# Patient Record
Sex: Female | Born: 1985 | Race: Black or African American | Hispanic: No | Marital: Single | Smoking: Never smoker
Health system: Southern US, Community
[De-identification: ages and names within clinical notes are randomized; demographics above are authoritative.]

## PROBLEM LIST (undated history)

## (undated) DIAGNOSIS — R011 Cardiac murmur, unspecified: Secondary | ICD-10-CM

## (undated) DIAGNOSIS — E669 Obesity, unspecified: Secondary | ICD-10-CM

## (undated) HISTORY — PX: CERVICAL BIOPSY  W/ LOOP ELECTRODE EXCISION: SUR135

## (undated) HISTORY — DX: Cardiac murmur, unspecified: R01.1

---

## 2013-03-04 LAB — OB RESULTS CONSOLE HGB/HCT, BLOOD
HCT: 37 %
HCT: 37 %

## 2013-03-04 LAB — OB RESULTS CONSOLE PLATELET COUNT: Platelets: 252 10*3/uL

## 2013-03-04 LAB — OB RESULTS CONSOLE ABO/RH

## 2013-03-04 LAB — OB RESULTS CONSOLE ANTIBODY SCREEN: Antibody Screen: NEGATIVE

## 2013-03-04 LAB — OB RESULTS CONSOLE RPR: RPR: NONREACTIVE

## 2013-05-01 ENCOUNTER — Encounter (HOSPITAL_BASED_OUTPATIENT_CLINIC_OR_DEPARTMENT_OTHER): Payer: Self-pay | Admitting: Student

## 2013-05-01 ENCOUNTER — Emergency Department (HOSPITAL_BASED_OUTPATIENT_CLINIC_OR_DEPARTMENT_OTHER)
Admission: EM | Admit: 2013-05-01 | Discharge: 2013-05-01 | Disposition: A | Discharge status: Home / Self Care | Payer: Medicaid Other | Attending: Emergency Medicine | Admitting: Emergency Medicine

## 2013-05-01 ENCOUNTER — Emergency Department (HOSPITAL_BASED_OUTPATIENT_CLINIC_OR_DEPARTMENT_OTHER): Payer: Medicaid Other

## 2013-05-01 DIAGNOSIS — Z9889 Other specified postprocedural states: Secondary | ICD-10-CM | POA: Insufficient documentation

## 2013-05-01 DIAGNOSIS — N898 Other specified noninflammatory disorders of vagina: Secondary | ICD-10-CM | POA: Insufficient documentation

## 2013-05-01 DIAGNOSIS — Z880 Allergy status to penicillin: Secondary | ICD-10-CM | POA: Insufficient documentation

## 2013-05-01 DIAGNOSIS — R1031 Right lower quadrant pain: Secondary | ICD-10-CM | POA: Insufficient documentation

## 2013-05-01 DIAGNOSIS — N760 Acute vaginitis: Secondary | ICD-10-CM | POA: Insufficient documentation

## 2013-05-01 DIAGNOSIS — N949 Unspecified condition associated with female genital organs and menstrual cycle: Secondary | ICD-10-CM | POA: Insufficient documentation

## 2013-05-01 DIAGNOSIS — B9689 Other specified bacterial agents as the cause of diseases classified elsewhere: Secondary | ICD-10-CM | POA: Insufficient documentation

## 2013-05-01 DIAGNOSIS — O26899 Other specified pregnancy related conditions, unspecified trimester: Secondary | ICD-10-CM | POA: Insufficient documentation

## 2013-05-01 DIAGNOSIS — R110 Nausea: Secondary | ICD-10-CM | POA: Insufficient documentation

## 2013-05-01 DIAGNOSIS — Z79899 Other long term (current) drug therapy: Secondary | ICD-10-CM | POA: Insufficient documentation

## 2013-05-01 DIAGNOSIS — Z3490 Encounter for supervision of normal pregnancy, unspecified, unspecified trimester: Secondary | ICD-10-CM

## 2013-05-01 DIAGNOSIS — A499 Bacterial infection, unspecified: Secondary | ICD-10-CM | POA: Insufficient documentation

## 2013-05-01 DIAGNOSIS — N390 Urinary tract infection, site not specified: Secondary | ICD-10-CM | POA: Insufficient documentation

## 2013-05-01 LAB — COMPREHENSIVE METABOLIC PANEL
AST: 14 U/L (ref 0–37)
Albumin: 3.1 g/dL — ABNORMAL LOW (ref 3.5–5.2)
BUN: 8 mg/dL (ref 6–23)
Creatinine, Ser: 0.8 mg/dL (ref 0.50–1.10)
Total Protein: 7.5 g/dL (ref 6.0–8.3)

## 2013-05-01 LAB — URINALYSIS, ROUTINE W REFLEX MICROSCOPIC
Glucose, UA: NEGATIVE mg/dL
Ketones, ur: NEGATIVE mg/dL
Protein, ur: 100 mg/dL — AB
Urobilinogen, UA: 0.2 mg/dL (ref 0.0–1.0)

## 2013-05-01 LAB — CBC WITH DIFFERENTIAL/PLATELET
Basophils Absolute: 0 10*3/uL (ref 0.0–0.1)
Basophils Relative: 0 % (ref 0–1)
Eosinophils Absolute: 0 10*3/uL (ref 0.0–0.7)
HCT: 33.6 % — ABNORMAL LOW (ref 36.0–46.0)
Hemoglobin: 12 g/dL (ref 12.0–15.0)
MCH: 30.5 pg (ref 26.0–34.0)
MCHC: 35.7 g/dL (ref 30.0–36.0)
Monocytes Absolute: 0.8 10*3/uL (ref 0.1–1.0)
Monocytes Relative: 7 % (ref 3–12)
Neutrophils Relative %: 75 % (ref 43–77)
RDW: 13.2 % (ref 11.5–15.5)

## 2013-05-01 LAB — URINE MICROSCOPIC-ADD ON

## 2013-05-01 LAB — PREGNANCY, URINE: Preg Test, Ur: POSITIVE — AB

## 2013-05-01 LAB — WET PREP, GENITAL
Trich, Wet Prep: NONE SEEN
Yeast Wet Prep HPF POC: NONE SEEN

## 2013-05-01 LAB — LIPASE, BLOOD: Lipase: 15 U/L (ref 11–59)

## 2013-05-01 MED ORDER — DEXTROSE 5 % IV SOLN
1.0000 g | Freq: Once | INTRAVENOUS | Status: AC
  Administered 2013-05-01: 1 g via INTRAVENOUS
  Filled 2013-05-01: qty 10

## 2013-05-01 MED ORDER — ONDANSETRON HCL 4 MG/2ML IJ SOLN
4.0000 mg | Freq: Once | INTRAMUSCULAR | Status: AC
  Administered 2013-05-01: 4 mg via INTRAVENOUS
  Filled 2013-05-01: qty 2

## 2013-05-01 MED ORDER — SODIUM CHLORIDE 0.9 % IV SOLN
INTRAVENOUS | Status: DC
  Administered 2013-05-01: 19:00:00 via INTRAVENOUS

## 2013-05-01 MED ORDER — METRONIDAZOLE 500 MG PO TABS: 500.0000 mg | ORAL_TABLET | Freq: Two times a day (BID) | ORAL | Status: DC

## 2013-05-01 MED ORDER — MORPHINE SULFATE 2 MG/ML IJ SOLN
2.0000 mg | Freq: Once | INTRAMUSCULAR | Status: AC
  Administered 2013-05-01: 2 mg via INTRAVENOUS
  Filled 2013-05-01: qty 1

## 2013-05-01 MED ORDER — ACETAMINOPHEN 325 MG PO TABS
325.0000 mg | ORAL_TABLET | Freq: Once | ORAL | Status: AC
  Administered 2013-05-01: 325 mg via ORAL
  Filled 2013-05-01: qty 1

## 2013-05-01 MED ORDER — ONDANSETRON HCL 4 MG PO TABS: 4.0000 mg | ORAL_TABLET | Freq: Four times a day (QID) | ORAL | Status: DC

## 2013-05-01 MED ORDER — NITROFURANTOIN MONOHYD MACRO 100 MG PO CAPS: 100.0000 mg | ORAL_CAPSULE | Freq: Two times a day (BID) | ORAL | Status: DC

## 2013-05-01 NOTE — ED Provider Notes (Signed)
This chart was scribed for Donna Skeens, MD by Ardelia Mems, ED Scribe. This patient was seen in room MH11/MH11 and the patient's care was started at 7:28 PM.  HPI Comments: Donna Hayes is a 27 y.o. Female, [redacted] weeks pregnant, who presents to the Emergency Department complaining of gradual onset, gradually worsening, "cramping" abdominal pain which is generalized, but worst in RLQ abdomen, onset 3 days ago. She states that she had a normal [place] about 5-6 weeks ago. She states that this is her 6th pregnancy, and that she has had 3 miscarriages, with which she has had similar pain. She denies history of ectopic pregnancies.  She denies any surgical abdominal history with the exception of a C-section. She denies any history of kidney stones. She states that she has had no new sexual partners. She states that she has a history of gonorrhea and/or chlamydia infection. She denies dysuria.   Physical Exam Diffuse abdominal pain, worst in the LLQ and RLQ. RRR.    7:32 PM- Bedside US performed. Fetal movement observed. Fetal HR of 162. Biparietal diameter indicates age of 17 weeks and 3 days.  Procedure EMERGENCY DEPARTMENT [place] PREGNANCY "Study: Limited Ultrasound of the Pelvis"  INDICATIONS:Pregnancy(required) abd pain Multiple views of the uterus and pelvic cavity are obtained with a multi-frequency probe.  APPROACH:Transabdominal   PERFORMED BY: Myself  IMAGES ARCHIVED?: Yes  LIMITATIONS: Body habitus  PREGNANCY FREE FLUID: Small  ADNEXAL FINDINGS: unable to appreciate due to body habitus  PREGNANCY FINDINGS: Intrauterine gestational sac noted and Fetal heart activity seen  INTERPRETATION: Viable intrauterine pregnancy  GESTATIONAL AGE, ESTIMATE: 17 wk 3 days  FETAL HEART RATE: 162  RLQ and right lower pelvic pain.  With no hx of ectopic and gradual worsening pain/ vaginal discharge for 2 -3 days concern for appy vs less likely PID/ ectopic.  Pain meds, fluids, labs. Plan for  transfer to women[facility] for further evaluation and imaging.  Unable to obtain formal US in ED, bedside performed.  Rocephin in ED.   OB physician rec CT scan. Discussed r/ b with patient in detail.  Pt okay with CT scan to rule out appy.  Improved in ED.  CT no acute findings.  Abx given in ED.  DC  I personally performed the services described in this documentation, which was scribed in my presence. The recorded information has been reviewed and is accurate.     Donna Skeens, MD 05/02/13 [phone]

## 2013-05-01 NOTE — ED Notes (Signed)
LAC drawn by RN Shary Key, Results 0 .67 hand delivered by this RT to Dr. Jodi Mourning at [phone].

## 2013-05-01 NOTE — ED Provider Notes (Signed)
CSN: [id number]     Arrival date & time 05/01/13  1336 History     First MD Initiated Contact with Patient 05/01/13 1752     Chief Complaint  Patient presents with  . Back Pain    cramps in lower back and right side.    (Consider location/radiation/quality/duration/timing/severity/associated sxs/prior Treatment) The history is provided by the patient. No language interpreter was used.  Donna Hayes is a 27 y/o F presenting to the ED with right sided abdominal/pelvic pain that has been ongoing for the past 3 days, with worsening of the pain today - patient reported being [redacted] weeks pregnant. Patient reported that the pain is localized to the right lower abdominal region described as a constant cramping sensation that is hard and deep with intermittent sharp shooting pain that lasts a couple of seconds. Patient reported that the pain radiates to the lower back on her right side. Stated that nothing makes the pain better or worse and stated that she has not used anything for the discomfort. Reported that she has been having nausea associated with increased pain. Reported that she has been experiencing vaginal discharge described as a creamy, white discharge that is non-malodorous. Reported that she has a UTI x 7 weeks ago and was placed on antibiotic therapy. Denied fever, chills, vomiting, diarrhea, neck pain, neck stiffness, vaginal bleeding, numbness, tingling, pruritis, vaginal pain.  [id number] - patient has history of GC/Chlamydia OBGYN Dr. Achilles Dunk   History reviewed. No pertinent past medical history. Past Surgical History  Procedure Laterality Date  . Cesarean section     History reviewed. No pertinent family history. History  Substance Use Topics  . Smoking status: Never Smoker   . Smokeless tobacco: Not on file  . Alcohol Use: No   OB History   Grav Para Term Preterm Abortions TAB SAB Ect Mult Living   1              Review of Systems  Constitutional: Negative for fever and  chills.  HENT: Negative for neck pain and neck stiffness.   Eyes: Negative for visual disturbance.  Respiratory: Negative for cough, chest tightness and shortness of breath.   Gastrointestinal: Positive for nausea and abdominal pain. Negative for vomiting, diarrhea, constipation and blood in stool.  Genitourinary: Positive for vaginal discharge and pelvic pain. Negative for dysuria, flank pain, vaginal bleeding and vaginal pain.  Musculoskeletal: Positive for back pain.  Neurological: Negative for dizziness, weakness and light-headedness.  All other systems reviewed and are negative.    Allergies  Penicillins  Home Medications   Current Outpatient Rx  Name  Route  Sig  Dispense  Refill  . metroNIDAZOLE (FLAGYL) 500 MG tablet   Oral   Take 1 tablet (500 mg total) by mouth 2 (two) times daily. One po bid x 7 days   14 tablet   0   . nitrofurantoin, macrocrystal-monohydrate, (MACROBID) 100 MG capsule   Oral   Take 1 capsule (100 mg total) by mouth 2 (two) times daily. X 7 days   14 capsule   0   . ondansetron (ZOFRAN) 4 MG tablet   Oral   Take 1 tablet (4 mg total) by mouth every 6 (six) hours.   12 tablet   0   . Prenatal Vit-Fe Fumarate-FA (MULTIVITAMIN-PRENATAL) 27-0.8 MG TABS tablet   Oral   Take 1 tablet by mouth daily at 12 noon.          BP 120/71  Pulse 61  Temp(Src) 98.8 F (37.1 C) (Oral)  Resp 20  Ht 5\' 2"  (1.575 m)  Wt 249 lb (112.946 kg)  BMI 45.53 kg/m2  SpO2 100%  LMP 01/02/2013 Physical Exam  Nursing note and vitals reviewed. Constitutional: She is oriented to person, place, and time. She appears well-developed and well-nourished. No distress.  HENT:  Head: Normocephalic and atraumatic.  Eyes: Conjunctivae and EOM are normal. Pupils are equal, round, and reactive to light. Right eye exhibits no discharge. Left eye exhibits no discharge.  Neck: Normal range of motion. Neck supple.  Cardiovascular: Normal rate, regular rhythm and normal heart  sounds.  Exam reveals no friction rub.   No murmur heard. Pulses:      Radial pulses are 2+ on the right side, and 2+ on the left side.       Dorsalis pedis pulses are 2+ on the right side, and 2+ on the left side.  Pulmonary/Chest: Effort normal and breath sounds normal. No respiratory distress. She has no wheezes. She has no rales.  Abdominal: Soft. Bowel sounds are normal. She exhibits no distension. There is tenderness in the right lower quadrant and suprapubic area. There is tenderness at McBurney's point. There is no rigidity, no rebound, no guarding and no CVA tenderness.    Pain localize to the RLQ and right pelvic region  Positive McBurney's point Negative psoas and obturator  Musculoskeletal: Normal range of motion.  Lymphadenopathy:    She has no cervical adenopathy.  Neurological: She is alert and oriented to person, place, and time. She exhibits normal muscle tone. Coordination normal.  Skin: Skin is warm and dry. She is not diaphoretic.  Psychiatric: She has a normal mood and affect. Her behavior is normal. Thought content normal.    ED Course   Procedures (including critical care time)  9:09PM Spoke with Dr. Tomi Bamberger from [facility] regarding patient - physician recommended CT scan of abdomen without contrast to rule out appendicitis and if CT scan negative patient is able to be discharged and follow-up with OBGYN.   9:12PM Discussed with patient CT scan to rule out appendicitis- discussed risk and benefits. Patient declined the scan, reported that she does not want to get CT scan or any other radiation exposure.  9:20PM Dr. Jodi Mourning spoke with patient, patient agreed to CT scan - patient signed consent form.  Dr. Jodi Mourning performed bedside US, since no [place] at [facility] on the weekends - intrauterine pregnancy reported.    Medications  ondansetron (ZOFRAN) injection 4 mg (4 mg Intravenous Given 05/01/13 1915)  acetaminophen (TYLENOL) tablet 325 mg (325 mg Oral Given  05/01/13 1908)  cefTRIAXone (ROCEPHIN) 1 g in dextrose 5 % 50 mL IVPB (0 g Intravenous Stopped 05/01/13 2121)  morphine 2 MG/ML injection 2 mg (2 mg Intravenous Given 05/01/13 2117)    Labs Reviewed  WET PREP, GENITAL - Abnormal; Notable for the following:    Clue Cells Wet Prep HPF POC TOO NUMEROUS TO COUNT (*)    WBC, Wet Prep HPF POC FEW (*)    All other components within normal limits  URINALYSIS, ROUTINE W REFLEX MICROSCOPIC - Abnormal; Notable for the following:    APPearance CLOUDY (*)    Hgb urine dipstick SMALL (*)    Protein, ur 100 (*)    Nitrite POSITIVE (*)    Leukocytes, UA TRACE (*)    All other components within normal limits  PREGNANCY, URINE - Abnormal; Notable for the following:    Preg Test, Ur POSITIVE (*)  All other components within normal limits  CBC WITH DIFFERENTIAL - Abnormal; Notable for the following:    HCT 33.6 (*)    Neutro Abs 7.9 (*)    All other components within normal limits  COMPREHENSIVE METABOLIC PANEL - Abnormal; Notable for the following:    Albumin 3.1 (*)    Total Bilirubin 0.2 (*)    All other components within normal limits  HCG, QUANTITATIVE, PREGNANCY - Abnormal; Notable for the following:    hCG, Beta Chain, Quant, S 26124 (*)    All other components within normal limits  URINE MICROSCOPIC-ADD ON - Abnormal; Notable for the following:    Squamous Epithelial / LPF FEW (*)    Bacteria, UA MANY (*)    All other components within normal limits  GC/CHLAMYDIA PROBE AMP  URINE CULTURE  LIPASE, BLOOD   Ct Abdomen Pelvis Wo Contrast  05/01/2013   *RADIOLOGY REPORT*  Clinical Data: Diffuse low abdominal pain.  Evaluate for appendicitis.  The patient is [redacted] weeks pregnant.  CT ABDOMEN AND PELVIS WITHOUT CONTRAST  Technique:  Multidetector CT imaging of the abdomen and pelvis was performed following the standard protocol without intravenous contrast. The patient signed consent for the procedure.  Comparison: None.  Findings: The lung bases are  clear.  There is no pleural effusion.  The appendix is well visualized and is normal in caliber without surrounding inflammatory change.  The bowel gas pattern is normal. There is no extraluminal fluid collection.  Gravid uterus is noted.  There is asymmetric right-sided hydronephrosis and hydroureter.  No obstructing calculus is demonstrated.  The left kidney appears normal.  The liver demonstrates mild steatosis without focal abnormality. The gallbladder, spleen, pancreas and adrenal glands appear normal.  No acute osseous findings are demonstrated.  IMPRESSION:  1.  No evidence of acute appendicitis nor other active inflammatory process. 2.  Right-sided hydronephrosis and hydroureter may be secondary to the gravid uterus.  No ureteral calculus is identified. Correlation with urinalysis recommended. 3.  Mild hepatic steatosis.   Original Report Authenticated By: Carey Bullocks, M.D.   1. BV (bacterial vaginosis)   2. UTI (urinary tract infection)   3. Pregnant     MDM  Patient presenting to the ED with right-sided pelvic pain ongoing for the past 2 days worsening starting today with radiation to the lower back associated with thick white discharge. Patient is currently [redacted] weeks pregnant. Tenderness upon palpation to the right lower quadrant right pelvic region. Negative acute abdomen, negative peritoneal signs. McBurney's point positive - negative obturator and psoas sign. Pelvic noted thick white discharge with right adnexal tenderness upon palpation. Negative CMT. Negative blood in vaginal vault. US performed at bedside by Dr. Jodi Mourning - intrauterine pregnancy identified. Urine pregnancy positive. Beta-hCG proper elevation coinciding with the weeks, properly elevating. Lipase negative elevation. UA positive for UTI. Wet prep clue cells noted. CT abdomen negative appendicitis findings.  Doubt ectopic pregnancy - patient at high risk since had history of STDs - [place] bedside performed by Dr. Jodi Mourning and  identified intrauterine pregnancy. Doubt miscarriage. Patient given pain medications, rocephin while in ED. Discussed case with Dr. Jodi Mourning and Dr. Jodi Mourning assessed patient. Suspicion of right pelvic pain secondary to BV and UTI - patient discharged with antibiotics and recommended Tylenol for pain control. Referred patient to OBGYN and stressed importance of being re-checked on Monday. Discussed with patient that further testing needs to be performed since BV is a STD. Discussed with patient to rest and stay hydrated.  Discussed with patient to continue to monitor symptoms closely and if symptoms are to worsen or change to report back to the ED - strict return instructions given.  Patient agreed to plan of care, understood, all questions answered.   Raymon Mutton, PA-C 05/02/13 0223  Raymon Mutton, PA-C 05/02/13 0226  Medical screening examination/treatment/procedure(s) were conducted as a shared visit with non-physician practitioner(s) or resident  and myself.  I personally evaluated the patient during the encounter and agree with the findings and plan unless otherwise indicated.  See attached note for detailed exam and plan.    [name], MD 05/02/13 [phone]

## 2013-05-01 NOTE — ED Notes (Signed)
3 miscarriages, 6 total pregnancies, 1 live birth. Reports prior hx of similar issues. Called OBGYN with onset 3 days ago. No vaginal bleeding, + vaginal dc - creamy in nature, denies dysuria.

## 2013-05-02 LAB — GC/CHLAMYDIA PROBE AMP
CT Probe RNA: NEGATIVE
GC Probe RNA: NEGATIVE

## 2013-05-03 LAB — CG4 I-STAT (LACTIC ACID): Lactic Acid, Venous: 0.67 mmol/L (ref 0.5–2.2)

## 2013-05-04 LAB — URINE CULTURE: Colony Count: >=100000

## 2013-05-05 NOTE — ED Notes (Signed)
+   Urine Culture Patient treated per protocol MD.  

## 2013-07-01 LAB — OB RESULTS CONSOLE HGB/HCT, BLOOD: Hemoglobin: 10.3 g/dL

## 2013-07-01 LAB — OB RESULTS CONSOLE RPR: RPR: NONREACTIVE

## 2013-07-02 LAB — GLUCOSE, 3 HOUR GESTATIONAL
Glucose, GTT - 2 Hour: 155 mg/dL — AB (ref ?–140)
Glucose, GTT - 3 Hour: 117 mg/dL (ref ?–140)

## 2013-07-22 ENCOUNTER — Encounter: Payer: Medicaid Other | Admitting: Advanced Practice Midwife

## 2013-09-09 ENCOUNTER — Ambulatory Visit (INDEPENDENT_AMBULATORY_CARE_PROVIDER_SITE_OTHER): Payer: Medicaid Other | Admitting: Obstetrics & Gynecology

## 2013-09-09 ENCOUNTER — Encounter: Payer: Self-pay | Admitting: Obstetrics & Gynecology

## 2013-09-09 VITALS — BP 116/83 | Temp 98.6°F | Wt 274.7 lb

## 2013-09-09 DIAGNOSIS — Z3490 Encounter for supervision of normal pregnancy, unspecified, unspecified trimester: Secondary | ICD-10-CM | POA: Insufficient documentation

## 2013-09-09 DIAGNOSIS — Z98891 History of uterine scar from previous surgery: Secondary | ICD-10-CM

## 2013-09-09 DIAGNOSIS — O34219 Maternal care for unspecified type scar from previous cesarean delivery: Secondary | ICD-10-CM | POA: Insufficient documentation

## 2013-09-09 DIAGNOSIS — Z3493 Encounter for supervision of normal pregnancy, unspecified, third trimester: Secondary | ICD-10-CM

## 2013-09-09 LAB — POCT URINALYSIS DIP (DEVICE)
Bilirubin Urine: NEGATIVE
Glucose, UA: NEGATIVE mg/dL
Hgb urine dipstick: NEGATIVE
Ketones, ur: NEGATIVE mg/dL
Protein, ur: NEGATIVE mg/dL
Specific Gravity, Urine: 1.015 (ref 1.005–1.030)
pH: 7 (ref 5.0–8.0)

## 2013-09-09 NOTE — Progress Notes (Signed)
Pulse- 98  Pressure-lower abd, vaginal  New ob packet given Weight gain 11-20lbs Flu and tdap given @ [facility]

## 2013-09-09 NOTE — Progress Notes (Signed)
transfer from [facility], counseled re TOLAC vs repeat cesarean, will discuss next visit

## 2013-09-09 NOTE — Patient Instructions (Signed)

## 2013-09-10 LAB — PRESCRIPTION MONITORING PROFILE (19 PANEL)
Benzodiazepine Screen, Urine: NEGATIVE ng/mL
Buprenorphine, Urine: NEGATIVE ng/mL
Cannabinoid Scrn, Ur: NEGATIVE ng/mL
Cocaine Metabolites: NEGATIVE ng/mL
Fentanyl, Ur: NEGATIVE ng/mL
MDMA URINE: NEGATIVE ng/mL
Meperidine, Ur: NEGATIVE ng/mL
Methaqualone: NEGATIVE ng/mL
Opiate Screen, Urine: NEGATIVE ng/mL
Phencyclidine, Ur: NEGATIVE ng/mL
Propoxyphene: NEGATIVE ng/mL
Zolpidem, Urine: NEGATIVE ng/mL

## 2013-09-12 LAB — CULTURE, OB URINE: Colony Count: 80000

## 2013-09-15 ENCOUNTER — Ambulatory Visit (INDEPENDENT_AMBULATORY_CARE_PROVIDER_SITE_OTHER): Payer: Medicaid Other | Admitting: Obstetrics & Gynecology

## 2013-09-15 VITALS — BP 137/93 | Wt 274.2 lb

## 2013-09-15 DIAGNOSIS — Z3493 Encounter for supervision of normal pregnancy, unspecified, third trimester: Secondary | ICD-10-CM

## 2013-09-15 DIAGNOSIS — O34219 Maternal care for unspecified type scar from previous cesarean delivery: Secondary | ICD-10-CM

## 2013-09-15 DIAGNOSIS — Z98891 History of uterine scar from previous surgery: Secondary | ICD-10-CM

## 2013-09-15 LAB — OB RESULTS CONSOLE GBS: GBS: POSITIVE

## 2013-09-15 LAB — POCT URINALYSIS DIP (DEVICE)
Bilirubin Urine: NEGATIVE
Ketones, ur: NEGATIVE mg/dL
Nitrite: NEGATIVE
Protein, ur: NEGATIVE mg/dL
pH: 6.5 (ref 5.0–8.0)

## 2013-09-15 MED ORDER — FLUCONAZOLE 150 MG PO TABS
150.0000 mg | ORAL_TABLET | Freq: Once | ORAL | Status: DC
Start: 2013-09-15 — End: 2013-09-28

## 2013-09-15 NOTE — Progress Notes (Signed)
Pulse: 111 Notices a slight vaginal odor.  Feels short of breath today.

## 2013-09-15 NOTE — Patient Instructions (Signed)
Cesarean Delivery  Cesarean delivery is the birth of a baby through a cut (incision) in the abdomen and womb (uterus).  LET YOUR CAREGIVER KNOW ABOUT:  Complicationsinvolving the pregnancy.  Allergies.  Medicines taken including herbs, eyedrops, over-the-counter medicines, and creams.  Use of steroids (by mouth or creams).  Previous problems with anesthetics or numbing medicine.  Previous surgery.  History of blood clots.  History of bleeding or blood problems.  Other health problems. RISKS AND COMPLICATIONS   Bleeding.  Infection.  Blood clots.  Injury to surrounding organs.  Anesthesia problems.  Injury to the baby. BEFORE THE PROCEDURE   A tube (Foley catheter) will be placed in your bladder. The Foley catheter drains the urine from your bladder into a bag. This keeps your bladder empty during surgery.  An intravenous access tube (IV) will be placed in your arm.  Hair may be removed from your pubic area and your lower abdomen. This is to prevent infection in the incision site.  You may be given an antacid medicine to drink. This will prevent acid contents in your stomach from going into your lungs if you vomit during the surgery.  You may be given an antibiotic medicine to prevent infection. PROCEDURE   You may be given medicine to numb the lower half of your body (regional anesthetic). If you were in labor, you may have already had an epidural in place which can be used in both labor and cesarean delivery. You may possibly be given medicine to make you sleep (general anesthetic) though this is not as common.  An incision will be made in your abdomen that extends to your uterus. There are 2 basic kinds of incisions:  The horizontal (transverse) incision. Horizontal incisions are used for most routine cesarean deliveries.  The vertical (up and down) incision. This is less commonly used. This is most often reserved for women who have a serious complication  (extreme prematurity) or under emergency situations.  The horizontal and vertical incisions may both be used at the same time. However, this is very uncommon.  Your baby will then be delivered. AFTER THE PROCEDURE   If you were awake during the surgery, you will see your baby right away. If you were asleep, you will see your baby as soon as you are awake.  You may breastfeed your baby after surgery.  You may be able to get up and walk the same day as the surgery. If you need to stay in bed for a period of time, you will receive help to turn, cough, and take deep breaths after surgery. This helps prevent lung problems such as pneumonia.  Do not get out of bed alone the first time after surgery. You will need help getting out of bed until you are able to do this by yourself.  You may be able to shower the day after your cesarean delivery. After the bandage (dressing) is taken off the incision site, a nurse will assist you to shower, if you like.  You will have pneumatic compressing hose placed on your feet or lower legs. These hose are used to prevent blood clots. When you are up and walking regularly, they will no longer be necessary.  Do not cross your legs when you sit.  Save any blood clots that you pass. If you pass a clot while on the toilet, do not flush it. Call for the nurse. Tell the nurse if you think you are bleeding too much or passing too many   clots.  Start drinking liquids and eating food as directed by your caregiver. If your stomach is not ready, drinking and eating too soon can cause an increase in bloating and swelling of your intestine and abdomen. This is very uncomfortable.  You will be given medicine as needed. Let your caregivers know if you are hurting. They want you to be comfortable. You may also be given an antibiotic to prevent an infection.  Your IV will be taken out when you are drinking a reasonable amount of fluids. The Foley catheter is taken out when  you are up and walking.  If your blood type is Rh negative and your baby's blood type is Rh positive, you will be given a shot of anti-D immune globulin. This shot prevents you from having Rh problems with a future pregnancy. You should get the shot even if you had your tubes tied (tubal ligation).  If you are allowed to take the baby for a walk, place the baby in the bassinet and push it. Do not carry your baby in your arms. Document Released: 09/09/2005 Document Revised: 12/02/2011 Document Reviewed: 03/31/2013 [employer] Patient Information 2014 [employer].  

## 2013-09-15 NOTE — Progress Notes (Signed)
Wants to schedule repeat cesarean section, no BTL, considering IUD. D/C c/w yeast, rx diflucan.

## 2013-09-16 LAB — GC/CHLAMYDIA PROBE AMP
CT Probe RNA: NEGATIVE
GC Probe RNA: NEGATIVE

## 2013-09-27 ENCOUNTER — Encounter (HOSPITAL_COMMUNITY): Payer: Self-pay | Admitting: Family Medicine

## 2013-09-27 ENCOUNTER — Encounter: Payer: Self-pay | Admitting: *Deleted

## 2013-09-27 DIAGNOSIS — Z9889 Other specified postprocedural states: Secondary | ICD-10-CM

## 2013-09-27 DIAGNOSIS — O3440 Maternal care for other abnormalities of cervix, unspecified trimester: Secondary | ICD-10-CM | POA: Diagnosis present

## 2013-09-27 DIAGNOSIS — D573 Sickle-cell trait: Secondary | ICD-10-CM | POA: Diagnosis present

## 2013-09-27 NOTE — H&P (Signed)
Donna DossMykiesha Hayes is an 28 y.o. [phone] female   Chief Complaint: Previous C-section, IUP@ 39 wks  HPI: Pt. With prior C-section who elects elective repeat.  Past Medical History  Diagnosis Date  . Heart murmur     Past Surgical History  Procedure Laterality Date  . Cesarean section      Family History  Problem Relation Age of Onset  . Diabetes Mother   . Lupus Mother   . Heart disease Mother    Social History:  reports that she has never smoked. She has never used smokeless tobacco. She reports that she does not drink alcohol or use illicit drugs.  Allergies:  Allergies  Allergen Reactions  . Penicillins Hives    No prescriptions prior to admission     A comprehensive review of systems was negative.  Last menstrual period 01/02/2013, unknown if currently breastfeeding. LMP 01/02/2013 General appearance: alert, cooperative and appears stated age Head: Normocephalic, without obvious abnormality, atraumatic Neck: supple, symmetrical, trachea midline Lungs: normal effort Heart: regular rate and rhythm Abdomen: normal findings: soft, non-tender and gravid Extremities: no cyanosis or clubbing Skin: Skin color, texture, turgor normal. No rashes or lesions Neurologic: Grossly normal   Lab Results  Component Value Date   WBC 10.5 05/01/2013   HGB 10.3 07/01/2013   HCT 33.6* 05/01/2013   MCV 85.3 05/01/2013   PLT 219 05/01/2013   Lab Results  Component Value Date   PREGTESTUR POSITIVE* 05/01/2013     Assessment/Plan Patient Active Problem List   Diagnosis Date Noted  . Supervision of low-risk pregnancy 09/09/2013  . Previous cesarean section 09/09/2013   For Elective repeat C-section  Donna Hayes S 09/27/2013, 2:48 PM

## 2013-09-28 ENCOUNTER — Encounter (HOSPITAL_COMMUNITY): Payer: Self-pay | Admitting: Pharmacist

## 2013-09-29 ENCOUNTER — Encounter (HOSPITAL_COMMUNITY): Payer: Self-pay | Admitting: *Deleted

## 2013-09-29 ENCOUNTER — Encounter: Payer: Self-pay | Admitting: *Deleted

## 2013-09-29 ENCOUNTER — Inpatient Hospital Stay (HOSPITAL_COMMUNITY)
Admission: RE | Admit: 2013-09-29 | Discharge: 2013-09-29 | Disposition: A | Discharge status: Home / Self Care | Payer: Medicaid Other | Source: Ambulatory Visit | Attending: Obstetrics and Gynecology | Admitting: Obstetrics and Gynecology

## 2013-09-29 ENCOUNTER — Ambulatory Visit (INDEPENDENT_AMBULATORY_CARE_PROVIDER_SITE_OTHER): Payer: Medicaid Other | Admitting: Obstetrics and Gynecology

## 2013-09-29 VITALS — BP 152/88 | Temp 97.5°F | Wt 279.6 lb

## 2013-09-29 DIAGNOSIS — O9989 Other specified diseases and conditions complicating pregnancy, childbirth and the puerperium: Secondary | ICD-10-CM

## 2013-09-29 DIAGNOSIS — R12 Heartburn: Principal | ICD-10-CM

## 2013-09-29 DIAGNOSIS — O139 Gestational [pregnancy-induced] hypertension without significant proteinuria, unspecified trimester: Secondary | ICD-10-CM | POA: Insufficient documentation

## 2013-09-29 DIAGNOSIS — R0989 Other specified symptoms and signs involving the circulatory and respiratory systems: Secondary | ICD-10-CM

## 2013-09-29 DIAGNOSIS — O479 False labor, unspecified: Secondary | ICD-10-CM | POA: Insufficient documentation

## 2013-09-29 DIAGNOSIS — O26893 Other specified pregnancy related conditions, third trimester: Secondary | ICD-10-CM

## 2013-09-29 DIAGNOSIS — I10 Essential (primary) hypertension: Secondary | ICD-10-CM

## 2013-09-29 HISTORY — DX: Obesity, unspecified: E66.9

## 2013-09-29 LAB — CBC
HCT: 33.5 % — ABNORMAL LOW (ref 36.0–46.0)
Hemoglobin: 11.8 g/dL — ABNORMAL LOW (ref 12.0–15.0)
MCH: 29.9 pg (ref 26.0–34.0)
MCHC: 35.2 g/dL (ref 30.0–36.0)
MCV: 84.8 fL (ref 78.0–100.0)
PLATELETS: 229 10*3/uL (ref 150–400)
RBC: 3.95 MIL/uL (ref 3.87–5.11)
RDW: 14.5 % (ref 11.5–15.5)
WBC: 8.2 10*3/uL (ref 4.0–10.5)

## 2013-09-29 LAB — COMPREHENSIVE METABOLIC PANEL
ALBUMIN: 2.8 g/dL — AB (ref 3.5–5.2)
ALT: 10 U/L (ref 0–35)
AST: 18 U/L (ref 0–37)
Alkaline Phosphatase: 126 U/L — ABNORMAL HIGH (ref 39–117)
BUN: 7 mg/dL (ref 6–23)
CO2: 23 meq/L (ref 19–32)
CREATININE: 0.73 mg/dL (ref 0.50–1.10)
Calcium: 9.2 mg/dL (ref 8.4–10.5)
Chloride: 100 mEq/L (ref 96–112)
GFR calc Af Amer: >90 mL/min (ref >90)
Glucose, Bld: 90 mg/dL (ref 70–99)
Potassium: 4.4 mEq/L (ref 3.7–5.3)
Sodium: 135 mEq/L — ABNORMAL LOW (ref 137–147)
TOTAL PROTEIN: 7.5 g/dL (ref 6.0–8.3)
Total Bilirubin: <0.2 mg/dL — ABNORMAL LOW (ref 0.3–1.2)

## 2013-09-29 LAB — POCT URINALYSIS DIP (DEVICE)
Bilirubin Urine: NEGATIVE
Glucose, UA: NEGATIVE mg/dL
HGB URINE DIPSTICK: NEGATIVE
Ketones, ur: NEGATIVE mg/dL
Leukocytes, UA: NEGATIVE
NITRITE: NEGATIVE
PROTEIN: NEGATIVE mg/dL
Specific Gravity, Urine: 1.015 (ref 1.005–1.030)
UROBILINOGEN UA: 0.2 mg/dL (ref 0.0–1.0)
pH: 7 (ref 5.0–8.0)

## 2013-09-29 LAB — PROTEIN / CREATININE RATIO, URINE
Creatinine, Urine: 34.22 mg/dL
Protein Creatinine Ratio: 0.12 (ref 0.00–0.15)
TOTAL PROTEIN, URINE: 4 mg/dL

## 2013-09-29 MED ORDER — OMEPRAZOLE 20 MG PO CPDR: 20.0000 mg | DELAYED_RELEASE_CAPSULE | Freq: Every day | ORAL | Status: DC

## 2013-09-29 NOTE — Progress Notes (Signed)
C/o heartburn, fatigue. Has RC/S 5 days. No H/A, visual sx. Diff FOB. Rpt BP still elevated> to MAU for further evaluation.

## 2013-09-29 NOTE — MAU Note (Signed)
Patient states she was seen in the clinic today and her blood pressure was elevated. Sent to MAU for evaluation. States her eyes are throbbing and burning and feels epigastric pressure that she was told was the baby causing pressure. Denies bleeding or leaking. Reports fetal movement. Has abdominal pressure and back pain.

## 2013-09-29 NOTE — Progress Notes (Signed)
Pulse- 82 Patient reports pelvic pressure and lower back pain

## 2013-09-29 NOTE — Discharge Instructions (Signed)

## 2013-09-29 NOTE — MAU Provider Note (Signed)
History     CSN: [id number]  Arrival date and time: 09/29/13 1145   None     Chief Complaint  Patient presents with  . Hypertension   Hypertension   Pt is a 28 y.o. [id number] at [redacted]w[redacted]d who presents with elevated blood pressure to 152/98 in clinic today and 140s/80s at previous clinic appointment. Pt reports eye throbbing that feels similar to headaches she gets when she is tired (which is what she attributes this to) and is not severe. She denies visual changes. She endorses hand and ankle edema that have worsened over the past week but only appear in the middle of the night for hands and after prolonged standing for ankles. She endorses contractions occasionally which are mildly uncomfortable.  OB History   Grav Para Term Preterm Abortions TAB SAB Ect Mult Living   5 1 1  4 2 2   1       Past Medical History  Diagnosis Date  . Heart murmur   . Obesity     Past Surgical History  Procedure Laterality Date  . Cesarean section      Family History  Problem Relation Age of Onset  . Diabetes Mother   . Lupus Mother   . Heart disease Mother     History  Substance Use Topics  . Smoking status: Never Smoker   . Smokeless tobacco: Never Used  . Alcohol Use: No    Allergies:  Allergies  Allergen Reactions  . Penicillins Hives    Prescriptions prior to admission  Medication Sig Dispense Refill  . Fe Fum-FePoly-Vit C-Vit B3 (INTEGRA PO) Take 1 tablet by mouth every other day.       . Prenatal Vit-Fe Fumarate-FA (MULTIVITAMIN-PRENATAL) 27-0.8 MG TABS tablet Take 1 tablet by mouth daily at 12 noon.      Marland Kitchen omeprazole (PRILOSEC) 20 MG capsule Take 1 capsule (20 mg total) by mouth daily.  30 capsule  1    ROS Physical Exam   Blood pressure 111/40, pulse 89, temperature 98.8 F (37.1 C), temperature source Oral, resp. rate 20, height 5' 2.5" (1.588 m), last menstrual period 01/02/2013, SpO2 100.00%.  Physical Exam  Nursing note and vitals reviewed. Constitutional: She  is oriented to person, place, and time. She appears well-developed and well-nourished. No distress.  HENT:  Head: Normocephalic and atraumatic.  Right Ear: External ear normal.  Left Ear: External ear normal.  Nose: Nose normal.  Eyes: Conjunctivae and EOM are normal. Right eye exhibits no discharge. Left eye exhibits no discharge. No scleral icterus.  Respiratory: Effort normal.  GI:  Gravid uterus  Musculoskeletal: She exhibits no edema and no tenderness.  Neurological: She is alert and oriented to person, place, and time. She displays normal reflexes. She exhibits normal muscle tone.  Skin: Skin is warm and dry. She is not diaphoretic.  Psychiatric: She has a normal mood and affect.    MAU Course  Procedures Fetal strip reactive, baseline 145, mod var, +accels, -decels Toco with occasional contractions (~q5-10)  MDM Recheck BPs here are normotensive but a little high at 130s/80s Sent UPC, CBC, CMP  Results for orders placed during the hospital encounter of 09/29/13 (from the past 24 hour(s))  PROTEIN / CREATININE RATIO, URINE     Status: None   Collection Time    09/29/13 12:05 PM      Result Value Range   Creatinine, Urine 34.22     Total Protein, Urine 4     PROTEIN CREATININE  RATIO 0.12  0.00 - 0.15  CBC     Status: Abnormal   Collection Time    09/29/13  1:00 PM      Result Value Range   WBC 8.2  4.0 - 10.5 K/uL   RBC 3.95  3.87 - 5.11 MIL/uL   Hemoglobin 11.8 (*) 12.0 - 15.0 g/dL   HCT 16.133.5 (*) 09.636.0 - 04.546.0 %   MCV 84.8  78.0 - 100.0 fL   MCH 29.9  26.0 - 34.0 pg   MCHC 35.2  30.0 - 36.0 g/dL   RDW 40.914.5  81.111.5 - 91.415.5 %   Platelets 229  150 - 400 K/uL  COMPREHENSIVE METABOLIC PANEL     Status: Abnormal   Collection Time    09/29/13  1:00 PM      Result Value Range   Sodium 135 (*) 137 - 147 mEq/L   Potassium 4.4  3.7 - 5.3 mEq/L   Chloride 100  96 - 112 mEq/L   CO2 23  19 - 32 mEq/L   Glucose, Bld 90  70 - 99 mg/dL   BUN 7  6 - 23 mg/dL   Creatinine, Ser  7.820.73  0.50 - 1.10 mg/dL   Calcium 9.2  8.4 - 95.610.5 mg/dL   Total Protein 7.5  6.0 - 8.3 g/dL   Albumin 2.8 (*) 3.5 - 5.2 g/dL   AST 18  0 - 37 U/L   ALT 10  0 - 35 U/L   Alkaline Phosphatase 126 (*) 39 - 117 U/L   Total Bilirubin <0.2 (*) 0.3 - 1.2 mg/dL   GFR calc non Af Amer >90  >90 mL/min   GFR calc Af Amer >90  >90 mL/min    Assessment and Plan  Labile hypertension, no evidence of pre-e on labs. BP all normo to hypotensive since arrival. Reactive NST. Contractions q5-10 on toco. D/c home. Continue plan for repeat c/s on Monday.  Beverely Lowdamo, Elena 09/29/2013, 3:15 PM   Seen by me also Agree with note Aviva SignsMarie L Krissa Utke, CNM

## 2013-09-29 NOTE — MAU Note (Signed)
Patient states she is scheduled for a cesarean section 1-12. State she has had a cold last week but denies S/S of flu

## 2013-10-01 ENCOUNTER — Encounter (HOSPITAL_COMMUNITY)
Admission: RE | Admit: 2013-10-01 | Discharge: 2013-10-01 | Disposition: A | Discharge status: Home / Self Care | Payer: Medicaid Other | Source: Ambulatory Visit | Attending: Family Medicine | Admitting: Family Medicine

## 2013-10-01 ENCOUNTER — Encounter (HOSPITAL_COMMUNITY): Payer: Self-pay

## 2013-10-01 DIAGNOSIS — Z01812 Encounter for preprocedural laboratory examination: Secondary | ICD-10-CM

## 2013-10-01 LAB — RPR: RPR: NONREACTIVE

## 2013-10-01 LAB — CBC
HCT: 31.6 % — ABNORMAL LOW (ref 36.0–46.0)
Hemoglobin: 11.1 g/dL — ABNORMAL LOW (ref 12.0–15.0)
MCH: 29.9 pg (ref 26.0–34.0)
MCHC: 35.1 g/dL (ref 30.0–36.0)
MCV: 85.2 fL (ref 78.0–100.0)
Platelets: 217 10*3/uL (ref 150–400)
RBC: 3.71 MIL/uL — ABNORMAL LOW (ref 3.87–5.11)
RDW: 14.7 % (ref 11.5–15.5)
WBC: 8.1 10*3/uL (ref 4.0–10.5)

## 2013-10-01 LAB — TYPE AND SCREEN
ABO/RH(D): A POS
Antibody Screen: NEGATIVE

## 2013-10-01 LAB — ABO/RH: ABO/RH(D): A POS

## 2013-10-01 NOTE — Patient Instructions (Signed)
20 Donna Hayes  10/01/2013   Your procedure is scheduled on:  10/04/13  [facility] AM.  Pick up the phone at the desk and dial [phone].   Call this number if you have problems the morning of surgery: [phone]   Remember:   Do not eat food:After Midnight.  Do not drink clear liquids: After Midnight.  Take these medicines the morning of surgery with A SIP OF WATER: NA   Do not wear jewelry, make-up or nail polish.  Do not wear lotions, powders, or perfumes. You may wear deodorant.  Do not shave 48 hours prior to surgery.  Do not bring valuables to the hospital.  [facility] is not   responsible for any belongings or valuables brought to the hospital.  Contacts, dentures or bridgework may not be worn into surgery.  Leave suitcase in the car. After surgery it may be brought to your room.  For patients admitted to the hospital, checkout time is 11:00 AM the day of              discharge.   Patients discharged the day of surgery will not be allowed to drive             home.  Name and phone number of your driver: NA  Special Instructions:   Shower using CHG 2 nights before surgery and the night before surgery.  If you shower the day of surgery use CHG.  Use special wash - you have one bottle of CHG for all showers.  You should use approximately 1/3 of the bottle for each shower.   Please read over the following fact sheets that you were given:   Surgical Site Infection Prevention

## 2013-10-03 MED ORDER — DEXTROSE 5 % IV SOLN
3.0000 g | INTRAVENOUS | Status: DC
  Filled 2013-10-03: qty 3000

## 2013-10-04 ENCOUNTER — Encounter (HOSPITAL_COMMUNITY): Payer: Self-pay | Admitting: Anesthesiology

## 2013-10-04 ENCOUNTER — Inpatient Hospital Stay (HOSPITAL_COMMUNITY)
Admission: RE | Admit: 2013-10-04 | Discharge: 2013-10-07 | DRG: 766 | Disposition: A | Discharge status: Home / Self Care | Payer: Medicaid Other | Source: Ambulatory Visit | Attending: Family Medicine | Admitting: Family Medicine

## 2013-10-04 ENCOUNTER — Inpatient Hospital Stay (HOSPITAL_COMMUNITY): Payer: Medicaid Other | Admitting: Anesthesiology

## 2013-10-04 ENCOUNTER — Encounter (HOSPITAL_COMMUNITY): Payer: Medicaid Other | Admitting: Anesthesiology

## 2013-10-04 ENCOUNTER — Encounter (HOSPITAL_COMMUNITY)
Admission: RE | Disposition: A | Discharge status: Home / Self Care | Payer: Self-pay | Source: Ambulatory Visit | Attending: Family Medicine

## 2013-10-04 ENCOUNTER — Inpatient Hospital Stay (HOSPITAL_COMMUNITY)
Admission: AD | Admit: 2013-10-04 | Discharge: 2013-10-04 | Disposition: A | Discharge status: Home / Self Care | Payer: Medicaid Other | Source: Ambulatory Visit

## 2013-10-04 DIAGNOSIS — D573 Sickle-cell trait: Secondary | ICD-10-CM | POA: Diagnosis present

## 2013-10-04 DIAGNOSIS — O34219 Maternal care for unspecified type scar from previous cesarean delivery: Secondary | ICD-10-CM

## 2013-10-04 DIAGNOSIS — O3440 Maternal care for other abnormalities of cervix, unspecified trimester: Secondary | ICD-10-CM | POA: Diagnosis present

## 2013-10-04 DIAGNOSIS — Z9889 Other specified postprocedural states: Secondary | ICD-10-CM

## 2013-10-04 DIAGNOSIS — Z98891 History of uterine scar from previous surgery: Secondary | ICD-10-CM

## 2013-10-04 SURGERY — Surgical Case
Anesthesia: Spinal | Site: Abdomen

## 2013-10-04 MED ORDER — BUPIVACAINE HCL (PF) 0.5 % IJ SOLN
INTRAMUSCULAR | Status: AC
  Filled 2013-10-04: qty 30

## 2013-10-04 MED ORDER — SCOPOLAMINE 1 MG/3DAYS TD PT72: 1.0000 | MEDICATED_PATCH | Freq: Once | TRANSDERMAL | Status: DC

## 2013-10-04 MED ORDER — MIDAZOLAM HCL 2 MG/2ML IJ SOLN: 0.5000 mg | Freq: Once | INTRAMUSCULAR | Status: DC | PRN

## 2013-10-04 MED ORDER — OXYTOCIN 10 UNIT/ML IJ SOLN
INTRAMUSCULAR | Status: AC
  Filled 2013-10-04: qty 4

## 2013-10-04 MED ORDER — BUPIVACAINE HCL (PF) 0.25 % IJ SOLN
INTRAMUSCULAR | Status: AC
  Filled 2013-10-04: qty 30

## 2013-10-04 MED ORDER — FENTANYL CITRATE 0.05 MG/ML IJ SOLN: 25.0000 ug | INTRAMUSCULAR | Status: DC | PRN

## 2013-10-04 MED ORDER — FENTANYL CITRATE 0.05 MG/ML IJ SOLN
INTRAMUSCULAR | Status: AC
  Filled 2013-10-04: qty 2

## 2013-10-04 MED ORDER — SCOPOLAMINE 1 MG/3DAYS TD PT72
MEDICATED_PATCH | TRANSDERMAL | Status: AC
  Filled 2013-10-04: qty 1

## 2013-10-04 MED ORDER — BUPIVACAINE IN DEXTROSE 0.75-8.25 % IT SOLN
INTRATHECAL | Status: DC | PRN
  Administered 2013-10-04: 1.4 mL via INTRATHECAL

## 2013-10-04 MED ORDER — DIPHENHYDRAMINE HCL 25 MG PO CAPS: 25.0000 mg | ORAL_CAPSULE | Freq: Four times a day (QID) | ORAL | Status: DC | PRN

## 2013-10-04 MED ORDER — ATROPINE SULFATE 0.4 MG/ML IJ SOLN
INTRAMUSCULAR | Status: DC | PRN
  Administered 2013-10-04: 0.4 mg via INTRAVENOUS

## 2013-10-04 MED ORDER — MAGNESIUM HYDROXIDE 400 MG/5ML PO SUSP
30.0000 mL | ORAL | Status: DC | PRN
  Administered 2013-10-06: 30 mL via ORAL
  Filled 2013-10-04: qty 30

## 2013-10-04 MED ORDER — KETOROLAC TROMETHAMINE 30 MG/ML IJ SOLN
INTRAMUSCULAR | Status: AC
  Filled 2013-10-04: qty 1

## 2013-10-04 MED ORDER — LACTATED RINGERS IV SOLN
INTRAVENOUS | Status: DC
  Administered 2013-10-04 (×3): via INTRAVENOUS

## 2013-10-04 MED ORDER — ONDANSETRON HCL 4 MG/2ML IJ SOLN
INTRAMUSCULAR | Status: DC | PRN
  Administered 2013-10-04: 4 mg via INTRAVENOUS

## 2013-10-04 MED ORDER — ATROPINE SULFATE 0.4 MG/ML IJ SOLN
INTRAMUSCULAR | Status: AC
  Filled 2013-10-04: qty 1

## 2013-10-04 MED ORDER — KETOROLAC TROMETHAMINE 30 MG/ML IJ SOLN
30.0000 mg | Freq: Four times a day (QID) | INTRAMUSCULAR | Status: AC | PRN
  Administered 2013-10-04: 30 mg via INTRAMUSCULAR

## 2013-10-04 MED ORDER — ONDANSETRON HCL 4 MG/2ML IJ SOLN: 4.0000 mg | Freq: Three times a day (TID) | INTRAMUSCULAR | Status: DC | PRN

## 2013-10-04 MED ORDER — IBUPROFEN 600 MG PO TABS
600.0000 mg | ORAL_TABLET | Freq: Four times a day (QID) | ORAL | Status: DC
  Administered 2013-10-05 – 2013-10-07 (×11): 600 mg via ORAL
  Filled 2013-10-04 (×11): qty 1

## 2013-10-04 MED ORDER — ONDANSETRON HCL 4 MG/2ML IJ SOLN: 4.0000 mg | INTRAMUSCULAR | Status: DC | PRN

## 2013-10-04 MED ORDER — SIMETHICONE 80 MG PO CHEW
80.0000 mg | CHEWABLE_TABLET | ORAL | Status: DC | PRN
  Administered 2013-10-06: 80 mg via ORAL
  Filled 2013-10-04: qty 1

## 2013-10-04 MED ORDER — DIPHENHYDRAMINE HCL 50 MG/ML IJ SOLN: 12.5000 mg | INTRAMUSCULAR | Status: DC | PRN

## 2013-10-04 MED ORDER — BUPIVACAINE HCL (PF) 0.25 % IJ SOLN
INTRAMUSCULAR | Status: DC | PRN
  Administered 2013-10-04: 60 mL

## 2013-10-04 MED ORDER — METOCLOPRAMIDE HCL 5 MG/ML IJ SOLN
10.0000 mg | Freq: Three times a day (TID) | INTRAMUSCULAR | Status: DC | PRN
Start: 2013-10-04 — End: 2013-10-07

## 2013-10-04 MED ORDER — MORPHINE SULFATE (PF) 0.5 MG/ML IJ SOLN
INTRAMUSCULAR | Status: DC | PRN
  Administered 2013-10-04: .2 mg via INTRATHECAL

## 2013-10-04 MED ORDER — ACETAMINOPHEN 500 MG PO TABS
1000.0000 mg | ORAL_TABLET | Freq: Four times a day (QID) | ORAL | Status: AC
  Administered 2013-10-05 (×2): 1000 mg via ORAL
  Filled 2013-10-04 (×2): qty 2

## 2013-10-04 MED ORDER — DIPHENHYDRAMINE HCL 50 MG/ML IJ SOLN: 25.0000 mg | INTRAMUSCULAR | Status: DC | PRN

## 2013-10-04 MED ORDER — DIPHENHYDRAMINE HCL 50 MG/ML IJ SOLN
25.0000 mg | Freq: Once | INTRAMUSCULAR | Status: AC
  Administered 2013-10-04: 25 mg via INTRAVENOUS

## 2013-10-04 MED ORDER — NALBUPHINE SYRINGE 5 MG/0.5 ML
5.0000 mg | INJECTION | INTRAMUSCULAR | Status: DC | PRN
  Administered 2013-10-04: 5 mg via INTRAVENOUS
  Filled 2013-10-04 (×2): qty 1

## 2013-10-04 MED ORDER — MORPHINE SULFATE 0.5 MG/ML IJ SOLN
INTRAMUSCULAR | Status: AC
  Filled 2013-10-04: qty 10

## 2013-10-04 MED ORDER — PRENATAL MULTIVITAMIN CH
1.0000 | ORAL_TABLET | Freq: Every day | ORAL | Status: DC
Start: 2013-10-04 — End: 2013-10-07
  Administered 2013-10-05 – 2013-10-07 (×3): 1 via ORAL
  Filled 2013-10-04 (×3): qty 1

## 2013-10-04 MED ORDER — PHENYLEPHRINE 8 MG IN D5W 100 ML (0.08MG/ML) PREMIX OPTIME
INJECTION | INTRAVENOUS | Status: DC | PRN
  Administered 2013-10-04: 60 ug/min via INTRAVENOUS

## 2013-10-04 MED ORDER — ONDANSETRON HCL 4 MG/2ML IJ SOLN
INTRAMUSCULAR | Status: AC
  Filled 2013-10-04: qty 2

## 2013-10-04 MED ORDER — LACTATED RINGERS IV SOLN
INTRAVENOUS | Status: DC
Start: 2013-10-04 — End: 2013-10-04
  Administered 2013-10-04: 09:00:00 via INTRAVENOUS

## 2013-10-04 MED ORDER — ONDANSETRON HCL 4 MG PO TABS: 4.0000 mg | ORAL_TABLET | ORAL | Status: DC | PRN

## 2013-10-04 MED ORDER — TETANUS-DIPHTH-ACELL PERTUSSIS 5-2.5-18.5 LF-MCG/0.5 IM SUSP: 0.5000 mL | Freq: Once | INTRAMUSCULAR | Status: DC

## 2013-10-04 MED ORDER — FAMOTIDINE IN NACL 20-0.9 MG/50ML-% IV SOLN
20.0000 mg | Freq: Once | INTRAVENOUS | Status: AC
  Administered 2013-10-04 (×2): 20 mg via INTRAVENOUS
  Filled 2013-10-04 (×2): qty 50

## 2013-10-04 MED ORDER — MEPERIDINE HCL 25 MG/ML IJ SOLN: 6.2500 mg | INTRAMUSCULAR | Status: DC | PRN

## 2013-10-04 MED ORDER — ZOLPIDEM TARTRATE 5 MG PO TABS: 5.0000 mg | ORAL_TABLET | Freq: Every evening | ORAL | Status: DC | PRN

## 2013-10-04 MED ORDER — MENTHOL 3 MG MT LOZG: 1.0000 | LOZENGE | OROMUCOSAL | Status: DC | PRN

## 2013-10-04 MED ORDER — OXYTOCIN 10 UNIT/ML IJ SOLN
40.0000 [IU] | INTRAVENOUS | Status: DC | PRN
  Administered 2013-10-04: 40 [IU] via INTRAVENOUS

## 2013-10-04 MED ORDER — LANOLIN HYDROUS EX OINT
1.0000 | TOPICAL_OINTMENT | CUTANEOUS | Status: DC | PRN
Start: 2013-10-04 — End: 2013-10-07

## 2013-10-04 MED ORDER — FENTANYL CITRATE 0.05 MG/ML IJ SOLN
INTRAMUSCULAR | Status: DC | PRN
Start: 2013-10-04 — End: 2013-10-04
  Administered 2013-10-04: 12.5 ug via INTRATHECAL

## 2013-10-04 MED ORDER — OXYTOCIN 40 UNITS IN LACTATED RINGERS INFUSION - SIMPLE MED: 62.5000 mL/h | INTRAVENOUS | Status: AC

## 2013-10-04 MED ORDER — NALOXONE HCL 1 MG/ML IJ SOLN: 1.0000 ug/kg/h | INTRAVENOUS | Status: DC | PRN

## 2013-10-04 MED ORDER — PHENYLEPHRINE 8 MG IN D5W 100 ML (0.08MG/ML) PREMIX OPTIME
INJECTION | INTRAVENOUS | Status: AC
  Filled 2013-10-04: qty 100

## 2013-10-04 MED ORDER — SIMETHICONE 80 MG PO CHEW
80.0000 mg | CHEWABLE_TABLET | Freq: Three times a day (TID) | ORAL | Status: DC
  Administered 2013-10-05 – 2013-10-07 (×7): 80 mg via ORAL
  Filled 2013-10-04 (×8): qty 1

## 2013-10-04 MED ORDER — SCOPOLAMINE 1 MG/3DAYS TD PT72
1.0000 | MEDICATED_PATCH | TRANSDERMAL | Status: DC
  Administered 2013-10-04: 1.5 mg via TRANSDERMAL

## 2013-10-04 MED ORDER — SIMETHICONE 80 MG PO CHEW
80.0000 mg | CHEWABLE_TABLET | ORAL | Status: DC
  Administered 2013-10-05 – 2013-10-06 (×3): 80 mg via ORAL
  Filled 2013-10-04 (×3): qty 1

## 2013-10-04 MED ORDER — SODIUM CHLORIDE 0.9 % IJ SOLN: 3.0000 mL | INTRAMUSCULAR | Status: DC | PRN

## 2013-10-04 MED ORDER — WITCH HAZEL-GLYCERIN EX PADS: 1.0000 "application " | MEDICATED_PAD | CUTANEOUS | Status: DC | PRN

## 2013-10-04 MED ORDER — SENNOSIDES-DOCUSATE SODIUM 8.6-50 MG PO TABS
2.0000 | ORAL_TABLET | ORAL | Status: DC
  Administered 2013-10-05 – 2013-10-06 (×3): 2 via ORAL
  Filled 2013-10-04 (×3): qty 2

## 2013-10-04 MED ORDER — MEASLES, MUMPS & RUBELLA VAC ~~LOC~~ INJ
0.5000 mL | INJECTION | Freq: Once | SUBCUTANEOUS | Status: DC
  Filled 2013-10-04: qty 0.5

## 2013-10-04 MED ORDER — OXYCODONE-ACETAMINOPHEN 5-325 MG PO TABS
1.0000 | ORAL_TABLET | ORAL | Status: DC | PRN
  Administered 2013-10-05: 1 via ORAL
  Administered 2013-10-05: 2 via ORAL
  Administered 2013-10-05 (×2): 1 via ORAL
  Administered 2013-10-06: 2 via ORAL
  Administered 2013-10-06: 1 via ORAL
  Administered 2013-10-06 – 2013-10-07 (×4): 2 via ORAL
  Filled 2013-10-04: qty 2
  Filled 2013-10-04: qty 1
  Filled 2013-10-04: qty 2
  Filled 2013-10-04: qty 1
  Filled 2013-10-04 (×2): qty 2
  Filled 2013-10-04: qty 1
  Filled 2013-10-04 (×3): qty 2

## 2013-10-04 MED ORDER — DIPHENHYDRAMINE HCL 50 MG/ML IJ SOLN
INTRAMUSCULAR | Status: AC
  Administered 2013-10-04: 25 mg via INTRAVENOUS
  Filled 2013-10-04: qty 1

## 2013-10-04 MED ORDER — NALOXONE HCL 0.4 MG/ML IJ SOLN: 0.4000 mg | INTRAMUSCULAR | Status: DC | PRN

## 2013-10-04 MED ORDER — IBUPROFEN 600 MG PO TABS: 600.0000 mg | ORAL_TABLET | Freq: Four times a day (QID) | ORAL | Status: DC | PRN

## 2013-10-04 MED ORDER — DIPHENHYDRAMINE HCL 25 MG PO CAPS
25.0000 mg | ORAL_CAPSULE | ORAL | Status: DC | PRN
  Administered 2013-10-06 (×2): 25 mg via ORAL
  Filled 2013-10-04 (×3): qty 1

## 2013-10-04 MED ORDER — NALBUPHINE SYRINGE 5 MG/0.5 ML
5.0000 mg | INJECTION | INTRAMUSCULAR | Status: DC | PRN
  Filled 2013-10-04: qty 1

## 2013-10-04 MED ORDER — DIBUCAINE 1 % RE OINT: 1.0000 "application " | TOPICAL_OINTMENT | RECTAL | Status: DC | PRN

## 2013-10-04 MED ORDER — KETOROLAC TROMETHAMINE 30 MG/ML IJ SOLN
30.0000 mg | Freq: Four times a day (QID) | INTRAMUSCULAR | Status: AC | PRN
  Administered 2013-10-04: 30 mg via INTRAVENOUS
  Filled 2013-10-04: qty 1

## 2013-10-04 MED ORDER — LACTATED RINGERS IV SOLN
INTRAVENOUS | Status: DC
  Administered 2013-10-04 – 2013-10-05 (×2): via INTRAVENOUS

## 2013-10-04 MED ORDER — PROMETHAZINE HCL 25 MG/ML IJ SOLN: 6.2500 mg | INTRAMUSCULAR | Status: DC | PRN

## 2013-10-04 MED ORDER — PHENYLEPHRINE HCL 10 MG/ML IJ SOLN
INTRAMUSCULAR | Status: AC
  Filled 2013-10-04: qty 1

## 2013-10-04 MED ORDER — DEXTROSE 5 % IV SOLN
3.0000 g | INTRAVENOUS | Status: DC | PRN
  Administered 2013-10-04: 3 g via INTRAVENOUS

## 2013-10-04 SURGICAL SUPPLY — 29 items
CLAMP CORD UMBIL (MISCELLANEOUS) IMPLANT
CLOTH BEACON ORANGE TIMEOUT ST (SAFETY) ×3 IMPLANT
DRAPE LG THREE QUARTER DISP (DRAPES) IMPLANT
DRSG OPSITE POSTOP 4X10 (GAUZE/BANDAGES/DRESSINGS) ×3 IMPLANT
DURAPREP 26ML APPLICATOR (WOUND CARE) ×3 IMPLANT
ELECT REM PT RETURN 9FT ADLT (ELECTROSURGICAL) ×3
ELECTRODE REM PT RTRN 9FT ADLT (ELECTROSURGICAL) ×1 IMPLANT
EXTRACTOR VACUUM M CUP 4 TUBE (SUCTIONS) IMPLANT
EXTRACTOR VACUUM M CUP 4' TUBE (SUCTIONS)
GLOVE BIOGEL PI IND STRL 7.0 (GLOVE) ×1 IMPLANT
GLOVE BIOGEL PI INDICATOR 7.0 (GLOVE) ×2
GLOVE ECLIPSE 7.0 STRL STRAW (GLOVE) ×6 IMPLANT
GOWN PREVENTION PLUS XLARGE (GOWN DISPOSABLE) ×3 IMPLANT
GOWN STRL REIN XL XLG (GOWN DISPOSABLE) ×3 IMPLANT
KIT ABG SYR 3ML LUER SLIP (SYRINGE) IMPLANT
NEEDLE HYPO 22GX1.5 SAFETY (NEEDLE) ×3 IMPLANT
NEEDLE HYPO 25X5/8 SAFETYGLIDE (NEEDLE) IMPLANT
NS IRRIG 1000ML POUR BTL (IV SOLUTION) ×3 IMPLANT
PACK C SECTION WH (CUSTOM PROCEDURE TRAY) ×3 IMPLANT
PAD OB MATERNITY 4.3X12.25 (PERSONAL CARE ITEMS) ×3 IMPLANT
RTRCTR C-SECT PINK 25CM LRG (MISCELLANEOUS) IMPLANT
STAPLER VISISTAT 35W (STAPLE) IMPLANT
SUT VIC AB 0 CTX 36 (SUTURE) ×6
SUT VIC AB 0 CTX36XBRD ANBCTRL (SUTURE) ×3 IMPLANT
SUT VIC AB 4-0 KS 27 (SUTURE) IMPLANT
SYR 30ML LL (SYRINGE) ×3 IMPLANT
TOWEL OR 17X24 6PK STRL BLUE (TOWEL DISPOSABLE) ×3 IMPLANT
TRAY FOLEY CATH 14FR (SET/KITS/TRAYS/PACK) ×3 IMPLANT
WATER STERILE IRR 1000ML POUR (IV SOLUTION) ×3 IMPLANT

## 2013-10-04 NOTE — Lactation Note (Signed)
This note was copied from the chart [facility]. Lactation Consultation Note: called to PACU to assist with latch. RN asking about using NS. Baby alert and rooting. Baby is sucking tongue some. Baby latched was on and off the breast but mom reports several good sucks/tugging.Did not use NS at this time. Encouraged skin to skin and to watch for feeding cues and feed whenever she sees them. No questions at present. To call for assist prn  Patient Name: Donna Ronnie DossMykiesha Seneca RUEAV'WToday's Date: 10/04/2013 Reason for consult: Initial assessment   Maternal Data Formula Feeding for Exclusion: No Infant to breast within first hour of birth: Yes Does the patient have breastfeeding experience prior to this delivery?: Yes  Feeding Feeding Type: Breast Fed Length of feed: 10 min  LATCH Score/Interventions Latch: Repeated attempts needed to sustain latch, nipple held in mouth throughout feeding, stimulation needed to elicit sucking reflex. Intervention(s): Assist with latch;Adjust position  Audible Swallowing: A few with stimulation  Type of Nipple: Everted at rest and after stimulation  Comfort (Breast/Nipple): Soft / non-tender     Hold (Positioning): Assistance needed to correctly position infant at breast and maintain latch. Intervention(s): Breastfeeding basics reviewed  LATCH Score: 7  Lactation Tools Discussed/Used     Consult Status Consult Status: Follow-up Date: 10/05/13 Follow-up type: In-patient    Pamelia HoitWeeks, Tavius Turgeon D 10/04/2013, 12:17 PM

## 2013-10-04 NOTE — Interval H&P Note (Signed)
History and Physical Interval Note:  10/04/2013 9:04 AM  Donna Hayes  has presented today for surgery, with the diagnosis of cpt 59514 - REPEAT c/section   The various methods of treatment have been discussed with the patient and family. After consideration of risks, benefits and other options for treatment, the patient has consented to  Procedure(s): CESAREAN SECTION (N/A) as a surgical intervention .  The patient's history has been reviewed, patient examined, no change in status, stable for surgery.  I have reviewed the patient's chart and labs.  Questions were answered to the patient's satisfaction.    Pt. Has had labile BP in the office with normal labs.  BP ok today. Teryn Gust S

## 2013-10-04 NOTE — Anesthesia Procedure Notes (Addendum)
Spinal  Patient location during procedure: OR Start time: 10/04/2013 12:59 AM End time: 10/04/2013 10:01 AM Staffing Anesthesiologist: Leilani AbleHATCHETT, Ayame Rena Performed by: anesthesiologist  Preanesthetic Checklist Completed: patient identified, surgical consent, pre-op evaluation, timeout performed, IV checked, risks and benefits discussed and monitors and equipment checked Spinal Block Patient position: sitting Prep: DuraPrep Patient monitoring: heart rate, cardiac monitor, continuous pulse ox and blood pressure Approach: midline Location: L3-4 Injection technique: single-shot Needle Needle type: Pencan  Needle gauge: 24 G Needle length: 9 cm Needle insertion depth: 8 cm Assessment Sensory level: T4

## 2013-10-04 NOTE — Op Note (Signed)
Donna Hayes PROCEDURE DATE: 10/04/2013  PREOPERATIVE DIAGNOSES: Intrauterine pregnancy at  10667w0d weeks gestation; elective repeat c/s, previous c/s x1, hx of previous wound infection after prior cesarean section.   POSTOPERATIVE DIAGNOSES: The same  PROCEDURE: Repeat Low Transverse Cesarean Section  SURGEON:  Dr. Tinnie Gensanya Pratt  ASSISTANT:  Dr. Rulon AbideKeli Davina Howlett  ANESTHESIOLOGIST: Dr. Lilli LightHatchet  INDICATIONS: Donna Hayes is a 28 y.o. [id number] at 8267w0d here for cesarean section secondary to the indications listed under preoperative diagnoses; please see preoperative note for further details.  The risks of cesarean section were discussed with the patient including but were not limited to: bleeding which may require transfusion or reoperation; infection which may require antibiotics; injury to bowel, bladder, ureters or other surrounding organs; injury to the fetus; need for additional procedures including hysterectomy in the event of a life-threatening hemorrhage; placental abnormalities wth subsequent pregnancies, incisional problems, thromboembolic phenomenon and other postoperative/anesthesia complications.   The patient concurred with the proposed plan, giving informed written consent for the procedure.    FINDINGS:  Viable female infant in cephalic presentation.  Apgars 9 and 9.  Clear amniotic fluid.  Intact placenta, three vessel cord.  Normal uterus, fallopian tubes and ovaries bilaterally. Significant uterine scar tissue on the left side adhering to the abdominal wall.   ANESTHESIA: Spinal INTRAVENOUS FLUIDS: 2700 ml ESTIMATED BLOOD LOSS: 700 ml URINE OUTPUT:  300 ml SPECIMENS: Placenta sent to L&D COMPLICATIONS: None immediate  PROCEDURE IN DETAIL:  The patient preoperatively received intravenous antibiotics and had sequential compression devices applied to her lower extremities.  She was then taken to the operating room where spinal anesthesia was administered and was found to be  adequate. She was then placed in a dorsal supine position with a leftward tilt, and prepped and draped in a sterile manner.  A foley catheter was placed into her bladder and attached to constant gravity.  After an adequate timeout was performed, a Pfannenstiel skin incision was made with scalpel and carried through to the underlying layer of fascia. The fascia was incised in the midline, and this incision was extended bilaterally using the Mayo scissors.  Kocher clamps were applied to the superior aspect of the fascial incision and the underlying rectus muscles were dissected off bluntly and with cautery due to adhesions. A similar process was carried out on the inferior aspect of the fascial incision. The rectus muscles were separated in the midline with a hemostat and the peritoneum was entered bluntly. Significant scar tissue was encountered from the uterus on the left up to the abdominal wall.  Care was taken to avoid those adhesions.  Attention was turned to the lower uterine segment where a low transverse hysterotomy was made with a scalpel and extended bilaterally bluntly.  The infant was successfully delivered, the cord was clamped and cut and the infant was handed over to awaiting neonatology team. Uterine massage was then administered, and the placenta delivered intact with a three-vessel cord. The uterus was then cleared of clot and debris.  The hysterotomy was closed with 0 Vicryl in a running locked fashion in a single layer closure. The pelvis was cleared of all clot and debris. Hemostasis was confirmed on all surfaces.  The peritoneum and the muscles were unable to be re approximated due to the significant scar burden. The fascia was then closed using 0 Vicryl in a running fashion.  The subcutaneous layer was irrigated, then reapproximated with 2-0 plain gut interrupted stitches, and 30 ml of 0.5% Marcaine was injected  subcutaneously around the incision.  The skin was closed with a 4-0 Vicryl  subcuticular stitch. A PICO wound vac was placed due to the patient's history of previous wound infection after last cesarean section.  The patient tolerated the procedure well. Sponge, lap, instrument and needle counts were correct x 2.  She was taken to the recovery room in stable condition.

## 2013-10-04 NOTE — Progress Notes (Signed)
UR chart review completed.  

## 2013-10-04 NOTE — Progress Notes (Signed)
Pt. Complaining of being nervous and stomach nausea before brought to room for C/S. Pt. Is allergic to Penecillin and was given 2gm Ancef. This was not an allergic reaction, but, we gave pt. Benadryl and waited 15 minutes before bringing her to OR 2. CFNewnam,RN

## 2013-10-04 NOTE — Anesthesia Postprocedure Evaluation (Signed)
Anesthesia Post Note  Patient: Engineer, [name]  Procedure(s) Performed: Procedure(s) (LRB): CESAREAN SECTION (N/A)  Anesthesia type: Spinal  Patient location: PACU  Post pain: Pain level controlled  Post assessment: Post-op Vital signs reviewed  Last Vitals:  Filed Vitals:   10/04/13 1145  BP: 120/73  Pulse: 59  Temp:   Resp: 19    Post vital signs: Reviewed  Level of consciousness: awake  Complications: No apparent anesthesia complications

## 2013-10-04 NOTE — Anesthesia Preprocedure Evaluation (Signed)
Anesthesia Evaluation  Patient identified by MRN, date of birth, ID band Patient awake    Reviewed: Allergy & Precautions, H&P , NPO status , Patient's Chart, lab work & pertinent test results  Airway Mallampati: III      Dental   Pulmonary  breath sounds clear to auscultation        Cardiovascular Exercise Tolerance: Good hypertension, Rhythm:regular Rate:Normal     Neuro/Psych    GI/Hepatic   Endo/Other  Morbid obesity  Renal/GU      Musculoskeletal   Abdominal   Peds  Hematology   Anesthesia Other Findings   Reproductive/Obstetrics (+) Pregnancy                           Anesthesia Physical Anesthesia Plan  ASA: III  Anesthesia Plan: Spinal   Post-op Pain Management:    Induction:   Airway Management Planned:   Additional Equipment:   Intra-op Plan:   Post-operative Plan:   Informed Consent: I have reviewed the patients History and Physical, chart, labs and discussed the procedure including the risks, benefits and alternatives for the proposed anesthesia with the patient or authorized representative who has indicated his/her understanding and acceptance.     Plan Discussed with: Anesthesiologist, CRNA and Surgeon  Anesthesia Plan Comments:         Anesthesia Quick Evaluation  

## 2013-10-04 NOTE — Lactation Note (Signed)
This note was copied from the chart of [facility]. Lactation Consultation Note Follow up care at 6 hours of age.  Mom already seen in PACU.  [facility] resources given and discussed at this visit.  Mom attempting latch in cradle hold.  Repositioned arms to cross cradle.  Assistance needed to latch baby.  Mom has very large pendulous breast, discussed lining baby's nose to nipple for latch.  Hand expression demonstrated to mom with no colostrum visible.  Repositioned to football hold on right breast. Assistance needed to achieve deep latch and baby sucks a few times and comes off.  Hand pump used with colostrum visible and nipple more erect.  Baby skin to skin and continues on and off breast.  Discussed feeding cues and cluster feedings.  Encouraged skin to skin.  Mom concerned about flat nipples and may want to pump and bottle feed.  Discussed mom has WIC, she will use hand pump to help stimulate nipples here.  Mom to call for assist as needed.   Patient Name: Donna Ronnie DossMykiesha Wince ZOXWR'UToday's Date: 10/04/2013 Reason for consult: Follow-up assessment   Maternal Data    Feeding Feeding Type: Breast Fed Length of feed: 2 min (on and off for 30 minutes with few sucks)  LATCH Score/Interventions Latch: Repeated attempts needed to sustain latch, nipple held in mouth throughout feeding, stimulation needed to elicit sucking reflex. Intervention(s): Adjust position;Assist with latch;Breast massage;Breast compression  Audible Swallowing: None Intervention(s): Skin to skin;Hand expression  Type of Nipple: Flat (hand pump) Intervention(s): Hand pump  Comfort (Breast/Nipple): Soft / non-tender     Hold (Positioning): Assistance needed to correctly position infant at breast and maintain latch. Intervention(s): Position options;Skin to skin;Support Pillows;Breastfeeding basics reviewed  LATCH Score: 5  Lactation Tools Discussed/Used Date initiated:: 10/04/13   Consult Status Consult  Status: Follow-up Date: 10/05/13 Follow-up type: In-patient    Jannifer RodneyShoptaw, Fredda Clarida Lynn 10/04/2013, 5:05 PM

## 2013-10-04 NOTE — Op Note (Signed)
I was present for all aspects of this surgery. 

## 2013-10-04 NOTE — Transfer of Care (Signed)
Immediate Anesthesia Transfer of Care Note  Patient: Donna Hayes  Procedure(s) Performed: Procedure(s): CESAREAN SECTION (N/A)  Patient Location: PACU  Anesthesia Type:Spinal  Level of Consciousness: awake, alert  and oriented  Airway & Oxygen Therapy: Patient Spontanous Breathing  Post-op Assessment: Report given to PACU RN and Post -op Vital signs reviewed and stable  Post vital signs: Reviewed and stable  Complications: No apparent anesthesia complications

## 2013-10-04 NOTE — Anesthesia Postprocedure Evaluation (Signed)
  Anesthesia Post-op Note  Patient: Engineer, [name]  Procedure(s) Performed: Procedure(s): CESAREAN SECTION (N/A)  Patient Location: Mother/Baby  Anesthesia Type:Spinal  Level of Consciousness: awake  Airway and Oxygen Therapy: Patient Spontanous Breathing  Post-op Pain: mild  Post-op Assessment: Patient's Cardiovascular Status Stable and Respiratory Function Stable  Post-op Vital Signs: stable  Complications: No apparent anesthesia complications

## 2013-10-04 NOTE — Consult Note (Signed)
Neonatology Note:   Attendance at C-section:    I was asked by Dr. Shawnie PonsPratt to attend this repeat C/S at term. The mother is a [id number] A pos, GBS pos with sickle cell trait and otherwise uncomplicated pregnancy. ROM at delivery, fluid clear. Infant vigorous with good spontaneous cry and tone. Needed only minimal bulb suctioning. Ap 9/9. Lungs clear to ausc in DR. To CN to care of Pediatrician.   Doretha Souhristie C. Priscila Bean, MD

## 2013-10-05 ENCOUNTER — Encounter (HOSPITAL_COMMUNITY): Payer: Self-pay | Admitting: Family Medicine

## 2013-10-05 LAB — CBC
HCT: 24.5 % — ABNORMAL LOW (ref 36.0–46.0)
HEMOGLOBIN: 8.7 g/dL — AB (ref 12.0–15.0)
MCH: 30.4 pg (ref 26.0–34.0)
MCHC: 35.5 g/dL (ref 30.0–36.0)
MCV: 85.7 fL (ref 78.0–100.0)
PLATELETS: 167 10*3/uL (ref 150–400)
RBC: 2.86 MIL/uL — ABNORMAL LOW (ref 3.87–5.11)
RDW: 14.9 % (ref 11.5–15.5)
WBC: 8.8 10*3/uL (ref 4.0–10.5)

## 2013-10-05 NOTE — Lactation Note (Signed)
This note was copied from the chart of Girl Acacia Lannom. [facility]  Patient Name: Girl Ronnie DossMykiesha Checo ZOXWR'UToday's Date: 10/05/2013  LC attempted to visit this mom and baby at 33 hours after delivery but mom is sound asleep.  LC reviewed with her nurse, Leeroy BockChelsea how baby has been nursing.  Per RN and feeding record, baby is exclusively breastfeeding for 20-50 minutes per feeding and output meeting guidelines for this hour of life.  LATCH score=7 today per RN assessment.   Maternal Data    Feeding    LATCH Score/Interventions                      Lactation Tools Discussed/Used     Consult Status   LC to follow tonight or tomorrow as needed   Lynda RainwaterBryant, Lyndsey Demos [place]ly 10/05/2013, 8:00 PM

## 2013-10-05 NOTE — Progress Notes (Signed)
Subjective: Postpartum Day 1: Cesarean Delivery Patient reports  incisional pain, tolerating PO and no problems voiding.   No flatus or bowel movement Patient is Up and walking Patient is breastfeeding with no difficulties Patient desires Mirena for contraception  Objective: Vital signs in last 24 hours: Temp:  [97.8 F (36.6 C)-98.6 F (37 C)] 98.2 F (36.8 C) (01/13 0400) Pulse Rate:  [53-100] 72 (01/13 0400) Resp:  [14-22] 20 (01/13 0400) BP: (80-138)/(39-90) 100/66 mmHg (01/13 0400) SpO2:  [94 %-100 %] 98 % (01/13 0400) Weight:  [127.914 kg (282 lb)] 127.914 kg (282 lb) (01/12 1124)  Physical Exam:  General: alert, cooperative and moderately obese Lochia: appropriate Uterine Fundus: firm Incision: no significant drainage, no significant erythema DVT Evaluation: No evidence of DVT seen on physical exam.   Recent Labs  10/05/13 0545  HGB 8.7*  HCT 24.5*    Assessment/Plan: Status post Cesarean section. Doing well postoperatively.  Continue current care.  Sallyanne HaversMuazu, Aisha 10/05/2013, 7:50 AM  I have seen and examined this patient and agree with above documentation in the med student's note. PICO in place. Inicision not examined but appears dry. Some dried blood ont eh right side but no bright red bleeding.     Rulon AbideKeli Lan Entsminger, M.D. [facility] 10/05/2013 11:27 AM

## 2013-10-06 MED ORDER — NALBUPHINE HCL 10 MG/ML IJ SOLN: 5.0000 mg | INTRAMUSCULAR | Status: DC | PRN

## 2013-10-06 NOTE — Progress Notes (Signed)
Subjective: Postpartum Day 2: Cesarean Delivery Patient reports cramping and passage of a large clot an hour ago. Bleeding is slightly more in amount compared to yesterday but not severe. + flatus.  No Bowel Movement yet.  Incisional pain only with movement, She is Up and Walking  Objective: Vital signs in last 24 hours: Temp:  [98 F (36.7 C)-98.4 F (36.9 C)] 98.4 F (36.9 C) (01/14 0609) Pulse Rate:  [72-73] 72 (01/14 0609) Resp:  [19-20] 19 (01/14 0609) BP: (102-124)/(58-78) 104/58 mmHg (01/14 0609) SpO2:  [98 %] 98 % (01/13 0900)  Physical Exam:  General: alert, cooperative and moderately obese Lochia: appropriate Uterine Fundus: firm Incision: no significant drainage, no significant erythema DVT Evaluation: No evidence of DVT seen on physical exam.   Recent Labs  10/05/13 0545  HGB 8.7*  HCT 24.5*    Assessment/Plan: Status post Cesarean section. Doing well postoperatively.   Discharge home tomorrow with standard precautions and return to clinic in 4-6 weeks.  Donna Hayes, Donna Hayes 10/06/2013, 7:45 AM  I have seen and examined this patient and agree with above documentation in the med student's note.   Rulon AbideKeli Paddy Neis, M.D. [facility] 10/06/2013 9:49 AM

## 2013-10-06 NOTE — Lactation Note (Signed)
This note was copied from the chart of [facility]. Lactation Consultation Note  Patient Name: Donna Hayes ZOXWR'UToday's Date: 10/06/2013 Reason for consult: Follow-up assessment  Baby sleeping next to mom's breast. Mom reports baby latched on better with last feeding for 30 minutes. Reviewed hand expression with mom. Assisted mom with latching baby. Baby latched deeply, maintained rhythmic sucking and swallowing. As mom let go of breast, baby would lose latch. Enc mom to continue to compress breast while baby nursing. Mom enc to feed with cues and request help if needed. Reviewed basics.  Maternal Data    Feeding Feeding Type: Breast Fed Length of feed:  (Still nursing after assessment finished.)  LATCH Score/Interventions Latch: Repeated attempts needed to sustain latch, nipple held in mouth throughout feeding, stimulation needed to elicit sucking reflex. Intervention(s): Adjust position;Assist with latch;Breast massage;Breast compression  Audible Swallowing: Spontaneous and intermittent Intervention(s): Skin to skin;Hand expression Intervention(s): Skin to skin  Type of Nipple: Flat Intervention(s): Hand pump  Comfort (Breast/Nipple): Soft / non-tender (Left nipple was sore, using colostrum.)  Problem noted: Severe discomfort  Hold (Positioning): Assistance needed to correctly position infant at breast and maintain latch. Intervention(s): Support Pillows;Breastfeeding basics reviewed  LATCH Score: 7  Lactation Tools Discussed/Used WIC Program: Yes   Consult Status Consult Status: PRN Date: 10/06/13 Follow-up type: In-patient    Geralynn OchsWILLIARD, [name] 10/06/2013, 2:51 PM

## 2013-10-07 MED ORDER — DOCUSATE SODIUM 100 MG PO CAPS: 100.0000 mg | ORAL_CAPSULE | Freq: Every day | ORAL | Status: DC | PRN

## 2013-10-07 MED ORDER — IBUPROFEN 600 MG PO TABS: 600.0000 mg | ORAL_TABLET | Freq: Four times a day (QID) | ORAL | Status: DC | PRN

## 2013-10-07 MED ORDER — OXYCODONE-ACETAMINOPHEN 5-325 MG PO TABS: 1.0000 | ORAL_TABLET | ORAL | Status: DC | PRN

## 2013-10-07 NOTE — Addendum Note (Signed)
Addendum created 10/07/13 1112 by Leilani AbleFranklin Westyn Keatley, MD   Modules edited: Anesthesia Responsible Staff

## 2013-10-07 NOTE — Discharge Summary (Signed)
  Obstetric Discharge Summary Reason for Admission: cesarean section Prenatal Procedures: ultrasound Intrapartum Procedures: cesarean: low cervical, transverse Postpartum Procedures: none Complications-Operative and Postpartum: none Hemoglobin  Date Value Range Status  10/05/2013 8.7* 12.0 - 15.0 g/dL Final  16/1/096010/05/2013 45.410.3   Final     HCT  Date Value Range Status  10/05/2013 24.5* 36.0 - 46.0 % Final  03/04/2013 37   Final  03/04/2013 37   Final    Physical Exam:  General: alert and appears stated age Lochia: appropriate Uterine Fundus: firm Incision: healing well with wound vac in place DVT Evaluation: No evidence of DVT seen on physical exam.  Discharge Diagnoses: Term Pregnancy-delivered  Discharge Information: Date: 10/07/2013 Activity: unrestricted Diet: routine Medications: Colace and Percocet and motrin Condition: stable Instructions: refer to practice specific booklet Discharge to: home   Newborn Data: Live born female  Birth Weight: 7 lb 3.9 oz (3285 g) APGAR: 9, 9  Home with mother  Hospital Course:  Donna Hayes is a 28 yo [id number] at 39 weeks who present for a scheduled RLTCS.  Procedure completed with spinal anesthesia, EBL of 700 cc and no initial complications.  Patient wound APGAR 9/9.  Mom is bottle feeding, using Mirena.  Donna Hayes, Donna Hayes 10/07/2013, 7:38 AM

## 2013-10-07 NOTE — Lactation Note (Signed)
This note was copied from the chart of Donna Hayes. Lactation Consultation Note  Patient Name: Donna Hayes WUJWJ'XToday's Date: 10/07/2013 Reason for consult: Follow-up assessment Mom has not been putting baby to breast, has been supplementing with formula. Offered to assist Mom with latching baby and helping her with plan to breast and bottle feed till her milk comes in and baby's weight stabilizes. At this point Mom is unsure of her plan but may decide to bottle and formula feed. Discussed pump and bottle feeding with Mom, left [facility] the [facility], [facility]. Advised [facility] to [facility] if she would like assist with latching baby or pump rental. Engorgement care reviewed if needed.   Maternal Data    Feeding Feeding Type: Formula  LATCH Score/Interventions                      Lactation Tools Discussed/Used     Consult Status Consult Status: Complete Date: 10/07/13 Follow-up type: In-patient    Donna LevinsGranger, Donna Hayes 10/07/2013, 10:40 AM

## 2013-10-07 NOTE — Progress Notes (Signed)
CSW met with pt briefly to assess her level of support. Pt is a single parent of 2, who lives alone. She recently placed her mother in a nursing facility, after her care became unmanageable. The FOB lives in [place], [place] & has no plans of coming to to the area, soon. Pt's chief complaint is her inability to take the baby to the doctors appointment tomorrow. She told CSW that she is sore from the cesarean & states she needs a couple of days to heal. She is confident that she will be in a better position to take the baby to the appointment on Monday. She does not have any friends or neighbors who she can rely on for help. CSW encouraged pt to continue to try to find someone who could assist or accompany her & baby to infants follow up appointment. She has all the necessary supplies for the infant. No social barriers to discharge.       

## 2013-10-07 NOTE — Discharge Instructions (Signed)
Cesarean Delivery  Cesarean delivery is the birth of a baby through a cut (incision) in the abdomen and womb (uterus).  LET [facility] PROVIDER KNOW ABOUT:  All medicines you are taking, including vitamins, herbs, eye drops, creams, and over-the-counter medicines.  Previous problems you or members of your family have had with the use of anesthetics.  Any blood disorders you have.  Previous surgeries you have had.  Medical conditions you have.  Any allergies you have.  Complicationsinvolving the pregnancy. RISKS AND COMPLICATIONS  Generally, this is a safe procedure. However, as with any procedure, complications can occur. Possible complications include:  Bleeding.  Infection.  Blood clots.  Injury to surrounding organs.  Problems with anesthesia.  Injury to the baby. BEFORE THE PROCEDURE   You may be given an antacid medicine to drink. This will prevent acid contents in your stomach from going into your lungs if you vomit during the surgery.  You may be given an antibiotic medicine to prevent infection. PROCEDURE   Hair may be removed from your pubic area and your lower abdomen. This is to prevent infection in the incision site.  A tube (Foley catheter) will be placed in your bladder to drain your urine from your bladder into a bag. This keeps your bladder empty during surgery.  An IV tube will be placed in your vein.  You may be given medicine to numb the lower half of your body (regional anesthetic). If you were in labor, you may have already had an epidural in place which can be used in both labor and cesarean delivery. You may possibly be given medicine to make you sleep (general anesthetic) though this is not as common.  An incision will be made in your abdomen that extends to your uterus. There are 2 basic kinds of incisions:  The horizontal (transverse) incision. Horizontal incisions are from side to side and are used for most routine cesarean  deliveries.  The vertical incision. The vertical incision is from the top of the abdomen to the bottom and is less commonly used. It is often done for women who have a serious complication (extreme prematurity) or under emergency situations.  The horizontal and vertical incisions may both be used at the same time. However, this is very uncommon.  An incision is then made in your uterus to deliver the baby.  Your baby will then be delivered.  Both incisions are then closed with absorbable stitches. AFTER THE PROCEDURE   If you were awake during the surgery, you will see your baby right away. If you were asleep, you will see your baby as soon as you are awake.  You may breastfeed your baby after surgery.  You may be able to get up and walk the same day as the surgery. If you need to stay in bed for a period of time, you will receive help to turn, cough, and take deep breaths after surgery. This helps prevent lung problems such as pneumonia.  Do not get out of bed alone the first time after surgery. You will need help getting out of bed until you are able to do this by yourself.  You may be able to shower the day after your cesarean delivery. After the bandage (dressing) is taken off the incision site, a nurse will assist you to shower if you would like help.  You will have pneumatic compression hose placed on your lower legs. This is done to prevent blood clots. When you are up  and walking regularly, they will no longer be necessary.  Do not cross your legs when you sit.  Save any blood clots that you pass. If you pass a clot while on the toilet, do not flush it. Call for the nurse. Tell the nurse if you think you are bleeding too much or passing too many clots.  You will be given medicine as needed. Let your health care providers know if you are hurting. You may also be given an antibiotic to prevent an infection.  Your IV tube will be taken out when you are drinking a reasonable  amount of fluids. The Foley catheter is taken out when you are up and walking.  If your blood type is Rh negative and your baby's blood type is Rh positive, you will be given a shot of anti-D immune globulin. This shot prevents you from having Rh problems with a future pregnancy. You should get the shot even if you had your tubes tied (tubal ligation).  If you are allowed to take the baby for a walk, place the baby in the bassinet and push it. Do not carry your baby in your arms. Document Released: 09/09/2005 Document Revised: 06/30/2013 Document Reviewed: 03/31/2013 [facility] Patient Information 2014 [place], [place]. Breastfeeding Deciding to breastfeed is one of the best choices you can make for you and your baby. A change in hormones during pregnancy causes your breast tissue to grow and increases the number and size of your milk ducts. These hormones also allow proteins, sugars, and fats from your blood supply to make breast milk in your milk-producing glands. Hormones prevent breast milk from being released before your baby is born as well as prompt milk flow after birth. Once breastfeeding has begun, thoughts of your baby, as well as his or her sucking or crying, can stimulate the release of milk from your milk-producing glands.  BENEFITS OF BREASTFEEDING For Your Baby  Your first milk (colostrum) helps your baby's digestive system function better.   There are antibodies in your milk that help your baby fight off infections.   Your baby has a lower incidence of asthma, allergies, and sudden infant death syndrome.   The nutrients in breast milk are better for your baby than infant formulas and are designed uniquely for your baby's needs.   Breast milk improves your baby's brain development.   Your baby is less likely to develop other conditions, such as childhood obesity, asthma, or type 2 diabetes mellitus.  For You   Breastfeeding helps to create a very special bond between you  and your baby.   Breastfeeding is convenient. Breast milk is always available at the correct temperature and costs nothing.   Breastfeeding helps to burn calories and helps you lose the weight gained during pregnancy.   Breastfeeding makes your uterus contract to its prepregnancy size faster and slows bleeding (lochia) after you give birth.   Breastfeeding helps to lower your risk of developing type 2 diabetes mellitus, osteoporosis, and breast or ovarian cancer later in life. SIGNS THAT YOUR BABY IS HUNGRY Early Signs of Hunger  Increased alertness or activity.  Stretching.  Movement of the head from side to side.  Movement of the head and opening of the mouth when the corner of the mouth or cheek is stroked (rooting).  Increased sucking sounds, smacking lips, cooing, sighing, or squeaking.  Hand-to-mouth movements.  Increased sucking of fingers or hands. Late Signs of Hunger  Fussing.  Intermittent crying. Extreme Signs of Hunger Signs  of extreme hunger will require calming and consoling before your baby will be able to breastfeed successfully. Do not wait for the following signs of extreme hunger to occur before you initiate breastfeeding:   Restlessness.  A loud, strong cry.   Screaming. BREASTFEEDING BASICS Breastfeeding Initiation  Find a comfortable place to sit or lie down, with your neck and back well supported.  Place a pillow or rolled up blanket under your baby to bring him or her to the level of your breast (if you are seated). Nursing pillows are specially designed to help support your arms and your baby while you breastfeed.  Make sure that your baby's abdomen is facing your abdomen.   Gently massage your breast. With your fingertips, massage from your chest wall toward your nipple in a circular motion. This encourages milk flow. You may need to continue this action during the feeding if your milk flows slowly.  Support your breast with 4 fingers  underneath and your thumb above your nipple. Make sure your fingers are well away from your nipple and your baby's mouth.   Stroke your baby's lips gently with your finger or nipple.   When your baby's mouth is open wide enough, quickly bring your baby to your breast, placing your entire nipple and as much of the colored area around your nipple (areola) as possible into your baby's mouth.   More areola should be visible above your baby's upper lip than below the lower lip.   Your baby's tongue should be between his or her lower gum and your breast.   Ensure that your baby's mouth is correctly positioned around your nipple (latched). Your baby's lips should create a seal on your breast and be turned out (everted).  It is common for your baby to suck about 2 3 minutes in order to start the flow of breast milk. Latching Teaching your baby how to latch on to your breast properly is very important. An improper latch can cause nipple pain and decreased milk supply for you and poor weight gain in your baby. Also, if your baby is not latched onto your nipple properly, he or she may swallow some air during feeding. This can make your baby fussy. Burping your baby when you switch breasts during the feeding can help to get rid of the air. However, teaching your baby to latch on properly is still the best way to prevent fussiness from swallowing air while breastfeeding. Signs that your baby has successfully latched on to your nipple:    Silent tugging or silent sucking, without causing you pain.   Swallowing heard between every 3 4 sucks.    Muscle movement above and in front of his or her ears while sucking.  Signs that your baby has not successfully latched on to nipple:   Sucking sounds or smacking sounds from your baby while breastfeeding.  Nipple pain. If you think your baby has not latched on correctly, slip your finger into the corner of your baby's mouth to break the suction and place  it between your baby's gums. Attempt breastfeeding initiation again. Signs of Successful Breastfeeding Signs from your baby:   A gradual decrease in the number of sucks or complete cessation of sucking.   Falling asleep.   Relaxation of his or her body.   Retention of a small amount of milk in his or her mouth.   Letting go of your breast by himself or herself. Signs from you:  Breasts that have increased  in firmness, weight, and size 1 3 hours after feeding.   Breasts that are softer immediately after breastfeeding.  Increased milk volume, as well as a change in milk consistency and color by the 5th day of breastfeeding.   Nipples that are not sore, cracked, or bleeding. Signs That Your Pecola Leisure is Getting Enough Milk  Wetting at least 3 diapers in a 24-hour period. The urine should be clear and pale yellow by age 644 days.  At least 3 stools in a 24-hour period by age 644 days. The stool should be soft and yellow.  At least 3 stools in a 24-hour period by age 64 days. The stool should be seedy and yellow.  No loss of weight greater than 10% of birth weight during the first 31 days of age.  Average weight gain of 4 7 ounces (120 210 mL) per week after age 64 days.  Consistent daily weight gain by age 644 days, without weight loss after the age of 2 weeks. After a feeding, your baby may spit up a small amount. This is common. BREASTFEEDING FREQUENCY AND DURATION Frequent feeding will help you make more milk and can prevent sore nipples and breast engorgement. Breastfeed when you feel the need to reduce the fullness of your breasts or when your baby shows signs of hunger. This is called "breastfeeding on demand." Avoid introducing a pacifier to your baby while you are working to establish breastfeeding (the first 4 6 weeks after your baby is born). After this time you may choose to use a pacifier. Research has shown that pacifier use during the first year of a baby's life decreases the  risk of sudden infant death syndrome (SIDS). Allow your baby to feed on each breast as long as he or she wants. Breastfeed until your baby is finished feeding. When your baby unlatches or falls asleep while feeding from the first breast, offer the second breast. Because newborns are often sleepy in the first few weeks of life, you may need to awaken your baby to get him or her to feed. Breastfeeding times will vary from baby to baby. However, the following rules can serve as a guide to help you ensure that your baby is properly fed:  Newborns (babies 50 weeks of age or younger) may breastfeed every 1 3 hours.  Newborns should not go longer than 3 hours during the day or 5 hours during the night without breastfeeding.  You should breastfeed your baby a minimum of 8 times in a 24-hour period until you begin to introduce solid foods to your baby at around 44 months of age. BREAST MILK PUMPING Pumping and storing breast milk allows you to ensure that your baby is exclusively fed your breast milk, even at times when you are unable to breastfeed. This is especially important if you are going back to work while you are still breastfeeding or when you are not able to be present during feedings. Your lactation consultant can give you guidelines on how long it is safe to store breast milk.  A breast pump is a machine that allows you to pump milk from your breast into a sterile bottle. The pumped breast milk can then be stored in a refrigerator or freezer. Some breast pumps are operated by hand, while others use electricity. Ask your lactation consultant which type will work best for you. Breast pumps can be purchased, but some hospitals and breastfeeding support groups lease breast pumps on a monthly basis. A lactation consultant  can teach you how to hand express breast milk, if you prefer not to use a pump.  CARING FOR YOUR BREASTS WHILE YOU BREASTFEED Nipples can become dry, cracked, and sore while breastfeeding.  The following recommendations can help keep your breasts moisturized and healthy:  Avoid using soap on your nipples.   Wear a supportive bra. Although not required, special nursing bras and tank tops are designed to allow access to your breasts for breastfeeding without taking off your entire bra or top. Avoid wearing underwire style bras or extremely tight bras.  Air dry your nipples for 3 after each feeding.   Use only cotton bra pads to absorb leaked breast milk. Leaking of breast milk between feedings is normal.   Use lanolin on your nipples after breastfeeding. Lanolin helps to maintain your skin's normal moisture barrier. If you use pure lanolin you do not need to wash it off before feeding your baby again. Pure lanolin is not toxic to your baby. You may also hand express a few drops of breast milk and gently massage that milk into your nipples and allow the milk to air dry. In the first few weeks after giving birth, some women experience extremely full breasts (engorgement). Engorgement can make your breasts feel heavy, warm, and tender to the touch. Engorgement peaks within 3 5 days after you give birth. The following recommendations can help ease engorgement:  Completely empty your breasts while breastfeeding or pumping. You may want to start by applying warm, moist heat (in the shower or with warm water-soaked hand towels) just before feeding or pumping. This increases circulation and helps the milk flow. If your baby does not completely empty your breasts while breastfeeding, pump any extra milk after he or she is finished.  Wear a snug bra (nursing or regular) or tank top for 1 2 days to signal your body to slightly decrease milk production.  Apply ice packs to your breasts, unless this is too uncomfortable for you.  Make sure that your baby is latched on and positioned properly while breastfeeding. If engorgement persists after 48 hours of following these  recommendations, contact your health care provider or a Advertising copywriter. OVERALL HEALTH CARE RECOMMENDATIONS WHILE BREASTFEEDING  Eat healthy foods. Alternate between meals and snacks, eating 3 of each per day. Because what you eat affects your breast milk, some of the foods may make your baby more irritable than usual. Avoid eating these foods if you are sure that they are negatively affecting your baby.  Drink milk, fruit juice, and water to satisfy your thirst (about 10 glasses a day).   Rest often, relax, and continue to take your prenatal vitamins to prevent fatigue, stress, and anemia.  Continue breast self-awareness checks.  Avoid chewing and smoking tobacco.  Avoid alcohol and drug use. Some medicines that may be harmful to your baby can pass through breast milk. It is important to ask your health care provider before taking any medicine, including all over-the-counter and prescription medicine as well as vitamin and herbal supplements. It is possible to become pregnant while breastfeeding. If birth control is desired, ask your health care provider about options that will be safe for your baby. SEEK MEDICAL CARE IF:   You feel like you want to stop breastfeeding or have become frustrated with breastfeeding.  You have painful breasts or nipples.  Your nipples are cracked or bleeding.  Your breasts are red, tender, or warm.  You have a swollen area on either breast.  You have a fever or chills.  You have nausea or vomiting.  You have drainage other than breast milk from your nipples.  Your breasts do not become full before feedings by the 5th day after you give birth.  You feel sad and depressed.  Your baby is too sleepy to eat well.  Your baby is having trouble sleeping.   Your baby is wetting less than 3 diapers in a 24-hour period.  Your baby has less than 3 stools in a 24-hour period.  Your baby's skin or the white part of his or her eyes becomes yellow.    Your baby is not gaining weight by 36 days of age. SEEK IMMEDIATE MEDICAL CARE IF:   Your baby is overly tired (lethargic) and does not want to wake up and feed.  Your baby develops an unexplained fever. Document Released: 09/09/2005 Document Revised: 05/12/2013 Document Reviewed: 03/03/2013 [facility] Patient Information 2014 [place], [place].

## 2013-10-14 ENCOUNTER — Encounter: Payer: Self-pay | Admitting: Family Medicine

## 2013-10-14 ENCOUNTER — Ambulatory Visit (INDEPENDENT_AMBULATORY_CARE_PROVIDER_SITE_OTHER): Payer: Medicaid Other | Admitting: Family Medicine

## 2013-10-14 VITALS — BP 135/91 | HR 94 | Temp 98.9°F | Wt 257.7 lb

## 2013-10-14 DIAGNOSIS — Z09 Encounter for follow-up examination after completed treatment for conditions other than malignant neoplasm: Secondary | ICD-10-CM

## 2013-10-14 DIAGNOSIS — Z98891 History of uterine scar from previous surgery: Secondary | ICD-10-CM

## 2013-10-14 DIAGNOSIS — K5900 Constipation, unspecified: Secondary | ICD-10-CM

## 2013-10-14 NOTE — Progress Notes (Signed)
Here today for check of wound, accidentally pulled out wound vac 2 days ago.  Also c/o constipation , is already on colace daily. Denies headache or visual changes.

## 2013-10-14 NOTE — Assessment & Plan Note (Signed)
Related to breast feeding and narcotic use.  Increase colace to bid, increase H2O intake, increase fiber.  Stop using Percocet.

## 2013-10-14 NOTE — Assessment & Plan Note (Signed)
Wound looks good--keep clean and dry.

## 2013-10-14 NOTE — Patient Instructions (Addendum)
Incision Care An incision is when a surgeon cuts into your body tissues. After surgery, the incision needs to be cared for properly to prevent infection.  HOME CARE INSTRUCTIONS   Take all medicine as directed by your caregiver. Only take over-the-counter or prescription medicines for pain, discomfort, or fever as directed by your caregiver.  Do not remove your bandage (dressing) or get your incision wet until your surgeon gives you permission. In the event that your dressing becomes wet, dirty, or starts to smell, change the dressing and call your surgeon for instructions as soon as possible.  Take showers. Do not take tub baths, swim, or do anything that may soak the wound until it is healed.  Resume your normal diet and activities as directed or allowed.  Avoid lifting any weight until you are instructed otherwise.  Use anti-itch antihistamine medicine as directed by your caregiver. The wound may itch when it is healing. Do not pick or scratch at the wound.  Follow up with your caregiver for stitch (suture) or staple removal as directed.  Drink enough fluids to keep your urine clear or pale yellow. SEEK MEDICAL CARE IF:   You have redness, swelling, or increasing pain in the wound that is not controlled with medicine.  You have drainage, blood, or pus coming from the wound that lasts longer than 1 day.  You develop muscle aches, chills, or a general ill feeling.  You notice a bad smell coming from the wound or dressing.  Your wound edges separate after the sutures, staples, or skin adhesive strips have been removed.  You develop persistent nausea or vomiting. SEEK IMMEDIATE MEDICAL CARE IF:   You have a fever.  You develop a rash.  You develop dizzy episodes or faint while standing.  You have difficulty breathing.  You develop any reaction or side effects to medicine given. MAKE SURE YOU:   Understand these instructions.  Will watch your condition.  Will get help  right away if you are not doing well or get worse. Document Released: 03/29/2005 Document Revised: 12/02/2011 Document Reviewed: 01/13/2011 [facility] Patient Information 2014 [place], [place]. Constipation, Adult Constipation is when a person has fewer than 3 bowel movements a week; has difficulty having a bowel movement; or has stools that are dry, hard, or larger than normal. As people grow older, constipation is more common. If you try to fix constipation with medicines that make you have a bowel movement (laxatives), the problem may get worse. Long-term laxative use may cause the muscles of the colon to become weak. A low-fiber diet, not taking in enough fluids, and taking certain medicines may make constipation worse. CAUSES   Certain medicines, such as antidepressants, pain medicine, iron supplements, antacids, and water pills.   Certain diseases, such as diabetes, irritable bowel syndrome (IBS), thyroid disease, or depression.   Not drinking enough water.   Not eating enough fiber-rich foods.   Stress or travel.  Lack of physical activity or exercise.  Not going to the restroom when there is the urge to have a bowel movement.  Ignoring the urge to have a bowel movement.  Using laxatives too much. SYMPTOMS   Having fewer than 3 bowel movements a week.   Straining to have a bowel movement.   Having hard, dry, or larger than normal stools.   Feeling full or bloated.   Pain in the lower abdomen.  Not feeling relief after having a bowel movement. DIAGNOSIS  Your caregiver will take a medical history  and perform a physical exam. Further testing may be done for severe constipation. Some tests may include:   A barium enema X-ray to examine your rectum, colon, and sometimes, your small intestine.  A sigmoidoscopy to examine your lower colon.  A colonoscopy to examine your entire colon. TREATMENT  Treatment will depend on the severity of your constipation and what is  causing it. Some dietary treatments include drinking more fluids and eating more fiber-rich foods. Lifestyle treatments may include regular exercise. If these diet and lifestyle recommendations do not help, your caregiver may recommend taking over-the-counter laxative medicines to help you have bowel movements. Prescription medicines may be prescribed if over-the-counter medicines do not work.  HOME CARE INSTRUCTIONS   Increase dietary fiber in your diet, such as fruits, vegetables, whole grains, and beans. Limit high-fat and processed sugars in your diet, such as [place] fries, hamburgers, cookies, candies, and soda.   A fiber supplement may be added to your diet if you cannot get enough fiber from foods.   Drink enough fluids to keep your urine clear or pale yellow.   Exercise regularly or as directed by your caregiver.   Go to the restroom when you have the urge to go. Do not hold it.  Only take medicines as directed by your caregiver. Do not take other medicines for constipation without talking to your caregiver first. SEEK IMMEDIATE MEDICAL CARE IF:   You have bright red blood in your stool.   Your constipation lasts for more than 4 days or gets worse.   You have abdominal or rectal pain.   You have thin, pencil-like stools.  You have unexplained weight loss. MAKE SURE YOU:   Understand these instructions.  Will watch your condition.  Will get help right away if you are not doing well or get worse. Document Released: 06/07/2004 Document Revised: 12/02/2011 Document Reviewed: 06/21/2013 [facility] Patient Information 2014 [place].

## 2013-10-14 NOTE — Progress Notes (Signed)
   Subjective:    Patient ID: Donna DossMykiesha Hayes, female    DOB: 02-19-1986, 28 y.o.   MRN: [id number]  HPI  C-section on 1/12.  Wound vac placed due to BMI and h/o wound infection previously.  Vac stopped working at 7 days.  Bandage is in place.  No fever or chills  Review of Systems  Constitutional: Negative for fever and chills.  Respiratory: Negative for shortness of breath.   Cardiovascular: Negative for chest pain and leg swelling.  Gastrointestinal: Positive for abdominal pain (appropriate for surgery) and constipation.       Objective:   Physical Exam  Vitals reviewed. Constitutional: She appears well-developed and well-nourished. No distress.  Cardiovascular: Normal rate.   Pulmonary/Chest: Effort normal.  Abdominal: Soft. There is tenderness (mild).  Skin:  Well healed.  No erythema at wound edges or edema.  AgNO3 was used to treat minimal separation noted.          Assessment & Plan:

## 2013-11-04 ENCOUNTER — Ambulatory Visit: Payer: Medicaid Other | Admitting: Obstetrics and Gynecology

## 2013-11-11 ENCOUNTER — Telehealth: Payer: Self-pay | Admitting: *Deleted

## 2013-11-11 NOTE — Telephone Encounter (Addendum)
Patient called nurse line and left message that she has an odor and drainage from her csection incision. She wants to see if she needs to come in now or if its okay to wait till pp visit. I called patient back and line was busy x2.  2/20  0850 - called pt and discussed her concern. She stated that she is still having a small amount of brownish discharge coming from the incision and it has a bad odor. I stated that we would like her to come to clinic this morning for evaluation.  Pt agreed and said she will be here by 1100.

## 2013-11-12 ENCOUNTER — Ambulatory Visit (INDEPENDENT_AMBULATORY_CARE_PROVIDER_SITE_OTHER): Payer: Medicaid Other | Admitting: Obstetrics & Gynecology

## 2013-11-12 ENCOUNTER — Encounter: Payer: Self-pay | Admitting: Obstetrics & Gynecology

## 2013-11-12 VITALS — BP 100/80 | HR 80 | Temp 97.9°F | Ht 63.0 in | Wt 255.2 lb

## 2013-11-12 DIAGNOSIS — Z98891 History of uterine scar from previous surgery: Secondary | ICD-10-CM

## 2013-11-12 DIAGNOSIS — Z09 Encounter for follow-up examination after completed treatment for conditions other than malignant neoplasm: Secondary | ICD-10-CM

## 2013-11-12 NOTE — Progress Notes (Signed)
Pt. States she is here today for a wound check s/p C/S. States there is a small circle with brownish/green, foul smelling discharge and frequent headaches; unsure if they are related.  Wound is dry and intact. Will f/u in 1 week as scheduled.  Adam PhenixJames G Arnold, MD 11/12/2013

## 2013-11-12 NOTE — Patient Instructions (Signed)
Postpartum Care After Cesarean Delivery °After you deliver your newborn (postpartum period), the usual stay in the hospital is 24 72 hours. If there were problems with your labor or delivery, or if you have other medical problems, you might be in the hospital longer.  °While you are in the hospital, you will receive help and instructions on how to care for yourself and your newborn during the postpartum period.  °While you are in the hospital: °· It is normal for you to have pain or discomfort from the incision in your abdomen. Be sure to tell your nurses when you are having pain, where the pain is located, and what makes the pain worse. °· If you are breastfeeding, you may feel uncomfortable contractions of your uterus for a couple of weeks. This is normal. The contractions help your uterus get back to normal size. °· It is normal to have some bleeding after delivery. °· For the first 1 3 days after delivery, the flow is red and the amount may be similar to a period. °· It is common for the flow to start and stop. °· In the first few days, you may pass some small clots. Let your nurses know if you begin to pass large clots or your flow increases. °· Do not  flush blood clots down the toilet before having the nurse look at them. °· During the next 3 10 days after delivery, your flow should become more watery and pink or brown-tinged in color. °· Ten to fourteen days after delivery, your flow should be a small amount of yellowish-white discharge. °· The amount of your flow will decrease over the first few weeks after delivery. Your flow may stop in 6 8 weeks. Most women have had their flow stop by 12 weeks after delivery. °· You should change your sanitary pads frequently. °· Wash your hands thoroughly with soap and water for at least 20 seconds after changing pads, using the toilet, or before holding or feeding your newborn. °· Your intravenous (IV) tubing will be removed when you are drinking enough fluids. °· The  urine drainage tube (urinary catheter) that was inserted before delivery may be removed within 6 8 hours after delivery or when feeling returns to your legs. You should feel like you need to empty your bladder within the first 6 8 hours after the catheter has been removed. °· In case you become weak, lightheaded, or faint, call your nurse before you get out of bed for the first time and before you take a shower for the first time. °· Within the first few days after delivery, your breasts may begin to feel tender and full. This is called engorgement. Breast tenderness usually goes away within 48 72 hours after engorgement occurs. You may also notice milk leaking from your breasts. If you are not breastfeeding, do not stimulate your breasts. Breast stimulation can make your breasts produce more milk. °· Spending as much time as possible with your newborn is very important. During this time, you and your newborn can feel close and get to know each other. Having your newborn stay in your room (rooming in) will help to strengthen the bond with your newborn. It will give you time to get to know your newborn and become comfortable caring for your newborn. °· Your hormones change after delivery. Sometimes the hormone changes can temporarily cause you to feel sad or tearful. These feelings should not last more than a few days. If these feelings last longer   than that, you should talk to your caregiver. °· If desired, talk to your caregiver about methods of family planning or contraception. °· Talk to your caregiver about immunizations. Your caregiver may want you to have the following immunizations before leaving the hospital: °· Tetanus, diphtheria, and pertussis (Tdap) or tetanus and diphtheria (Td) immunization. It is very important that you and your family (including grandparents) or others caring for your newborn are up-to-date with the Tdap or Td immunizations. The Tdap or Td immunization can help protect your newborn  from getting ill. °· Rubella immunization. °· Varicella (chickenpox) immunization. °· Influenza immunization. You should receive this annual immunization if you did not receive the immunization during your pregnancy. °Document Released: 06/03/2012 Document Reviewed: 06/03/2012 °Exit[employer]® Patient Information ©2014 [employer]. ° °

## 2013-11-19 ENCOUNTER — Ambulatory Visit: Payer: Medicaid Other | Admitting: Obstetrics & Gynecology

## 2014-02-22 ENCOUNTER — Telehealth: Payer: Self-pay

## 2014-02-22 DIAGNOSIS — B379 Candidiasis, unspecified: Secondary | ICD-10-CM

## 2014-02-22 NOTE — Telephone Encounter (Signed)
Patient called stating she would like to speak to a nurse regarding discharge.

## 2014-02-23 MED ORDER — FLUCONAZOLE 150 MG PO TABS: 150.0000 mg | ORAL_TABLET | Freq: Once | ORAL | Status: DC

## 2014-02-23 NOTE — Telephone Encounter (Signed)
Called patient who stated she has been having whitish discharge with itching and vaginal irritation/swelling and feels just like when she had multiple yeast infections in pregnancy. Patient also states she noticed a small amount of blood in the toilet two days ago but has not noticed it since. Patient denies any burning or pain with urination. Informed patient that I do not believe it would be due to UTI as she is not experiencing any other symptoms. Asked patient when she is due for her period and she stated her period ended two weeks ago; informed patient the spotting could be break through bleeding or could be due to a yeast infection. Informed patient I would send Diflucan to pharmacy and to call clinic if symptoms persist after 3 days. Patient verbalized understanding and gratitude. No further questions or concerns.

## 2014-03-11 ENCOUNTER — Other Ambulatory Visit: Payer: Self-pay | Admitting: *Deleted

## 2014-03-11 DIAGNOSIS — I83893 Varicose veins of bilateral lower extremities with other complications: Secondary | ICD-10-CM

## 2014-03-17 ENCOUNTER — Encounter: Payer: Self-pay | Admitting: Obstetrics & Gynecology

## 2014-03-17 ENCOUNTER — Other Ambulatory Visit (HOSPITAL_COMMUNITY)
Admission: RE | Admit: 2014-03-17 | Discharge: 2014-03-17 | Disposition: A | Discharge status: Home / Self Care | Payer: Medicaid Other | Source: Ambulatory Visit | Attending: Obstetrics & Gynecology | Admitting: Obstetrics & Gynecology

## 2014-03-17 ENCOUNTER — Ambulatory Visit (INDEPENDENT_AMBULATORY_CARE_PROVIDER_SITE_OTHER): Payer: Medicaid Other | Admitting: Obstetrics & Gynecology

## 2014-03-17 VITALS — BP 125/88 | HR 82 | Temp 98.6°F | Ht 62.5 in | Wt 264.3 lb

## 2014-03-17 DIAGNOSIS — Z01419 Encounter for gynecological examination (general) (routine) without abnormal findings: Secondary | ICD-10-CM | POA: Insufficient documentation

## 2014-03-17 DIAGNOSIS — Z113 Encounter for screening for infections with a predominantly sexual mode of transmission: Secondary | ICD-10-CM | POA: Insufficient documentation

## 2014-03-17 LAB — POCT PREGNANCY, URINE: Preg Test, Ur: NEGATIVE

## 2014-03-17 NOTE — Patient Instructions (Signed)
Sexually Transmitted Disease A sexually transmitted disease (STD) is a disease or infection that may be passed (transmitted) from person to person, usually during sexual activity. This may happen by way of saliva, semen, blood, vaginal mucus, or urine. Common STDs include:   Gonorrhea.   Chlamydia.   Syphilis.   HIV and AIDS.   Genital herpes.   Hepatitis B and C.   Trichomonas.   Human papillomavirus (HPV).   Pubic lice.   Scabies.  Mites.  Bacterial vaginosis. WHAT ARE CAUSES OF STDs? An STD may be caused by bacteria, a virus, or parasites. STDs are often transmitted during sexual activity if one person is infected. However, they may also be transmitted through nonsexual means. STDs may be transmitted after:   Sexual intercourse with an infected person.   Sharing sex toys with an infected person.   Sharing needles with an infected person or using unclean piercing or tattoo needles.  Having intimate contact with the genitals, mouth, or rectal areas of an infected person.   Exposure to infected fluids during birth. WHAT ARE THE SIGNS AND SYMPTOMS OF STDs? Different STDs have different symptoms. Some people may not have any symptoms. If symptoms are present, they may include:   Painful or bloody urination.   Pain in the pelvis, abdomen, vagina, anus, throat, or eyes.   A skin rash, itching, or irritation.  Growths, ulcerations, blisters, or sores in the genital and anal areas.  Abnormal vaginal discharge with or without bad odor.   Penile discharge in men.   Fever.   Pain or bleeding during sexual intercourse.   Swollen glands in the groin area.   Yellow skin and eyes (jaundice). This is seen with hepatitis.   Swollen testicles.  Infertility.  Sores and blisters in the mouth. HOW ARE STDs DIAGNOSED? To make a diagnosis, your health care Donna Hayes may:   Take a medical history.   Perform a physical exam.   Take a sample of  any discharge to examine.  Swab the throat, cervix, opening to the penis, rectum, or vagina for testing.  Test a sample of your first morning urine.   Perform blood tests.   Perform a Pap test, if this applies.   Perform a colposcopy.   Perform a laparoscopy.  HOW ARE STDs TREATED? Treatment depends on the STD. Some STDs may be treated but not cured.   Chlamydia, gonorrhea, trichomonas, and syphilis can be cured with antibiotic medicine.   Genital herpes, hepatitis, and HIV can be treated, but not cured, with prescribed medicines. The medicines lessen symptoms.   Genital warts from HPV can be treated with medicine or by freezing, burning (electrocautery), or surgery. Warts may come back.   HPV cannot be cured with medicine or surgery. However, abnormal areas may be removed from the cervix, vagina, or vulva.   If your diagnosis is confirmed, your recent sexual partners need treatment. This is true even if they are symptom-free or have a negative culture or evaluation. They should not have sex until their health care providers say it is okay. HOW CAN I REDUCE MY RISK OF GETTING AN STD? Take these steps to reduce your risk of getting an STD:  Use latex condoms, dental dams, and water-soluble lubricants during sexual activity. Do not use petroleum jelly or oils.  Avoid having multiple sex partners.  Do not have sex with someone who has other sex partners.  Do not have sex with anyone you do not know or who is at   high risk for an STD.  Avoid risky sex practices that can break your skin.  Do not have sex if you have open sores on your mouth or skin.  Avoid drinking too much alcohol or taking illegal drugs. Alcohol and drugs can affect your judgment and put you in a vulnerable position.  Avoid engaging in oral and anal sex acts.  Get vaccinated for HPV and hepatitis. If you have not received these vaccines in the past, talk to your health care Donna Hayes about whether one  or both might be right for you.   If you are at risk of being infected with HIV, it is recommended that you take a prescription medicine daily to prevent HIV infection. This is called pre-exposure prophylaxis (PrEP). You are considered at risk if:  You are a man who has sex with other men (MSM).  You are a heterosexual man or woman and are sexually active with more than one partner.  You take drugs by injection.  You are sexually active with a partner who has HIV.  Talk with your health care Donna Hayes about whether you are at high risk of being infected with HIV. If you choose to begin PrEP, you should first be tested for HIV. You should then be tested every 3 months for as long as you are taking PrEP.  WHAT SHOULD I DO IF I THINK I HAVE AN STD?  See your health care Donna Hayes.   Tell your sexual partner(s). They should be tested and treated for any STDs.  Do not have sex until your health care Donna Hayes says it is okay. WHEN SHOULD I GET IMMEDIATE MEDICAL CARE? Contact your health care Donna Hayes right away if:   You have severe abdominal pain.  You are a man and notice swelling or pain in your testicles.  You are a woman and notice swelling or pain in your vagina. Document Released: 11/30/2002 Document Revised: 09/14/2013 Document Reviewed: 03/30/2013 ExitCare Patient Information 2015 [employer]. This information is not intended to replace advice given to you by your health care Donna Hayes. Make sure you discuss any questions you have with your health care Donna Hayes.  

## 2014-03-17 NOTE — Progress Notes (Signed)
Patient ID: Ronnie DossMykiesha Ulysse, female   DOB: 1985/10/21, 28 y.o.   MRN: [id number] GYNECOLOGY CLINIC PROCEDURE NOTE  Ronnie DossMykiesha Vaeth is a 28 y.o. [id number] here for Mirena IUD insertion. Seen earlier by resident for annual and STI screen   IUD Insertion Procedure Note Patient identified, informed consent performed.  Discussed risks of irregular bleeding, cramping, infection, malpositioning or misplacement of the IUD outside the uterus which may require further procedures. Time out was performed.  Urine pregnancy test negative.  Speculum placed in the vagina.  Cervix visualized.  Cleaned with Betadine x 2.  Grasped anteriorly with a single tooth tenaculum.  Uterus sounded to 9 cm.  Mirena IUD placed per manufacturer's recommendations.  Strings trimmed to 3 cm. Tenaculum was removed, good hemostasis noted.  Patient tolerated procedure well.   Patient was given post-procedure instructions.  Patient was also asked to check IUD strings periodically and follow up in 4 weeks for IUD check.  Lawson Mahone L. Harraway-Smith, M.D., [facility] CoreFACOG

## 2014-03-17 NOTE — Progress Notes (Signed)
Gyn Clinic Visit     Donna DossMykiesha Hayes is a 28 y.o. female who presents for gyn follow up.  She missed her postpartum visit from delivery in 09/2013.   I have fully reviewed the prenatal and intrapartum course. The delivery was at 39 gestational weeks. Outcome: RLTCS. Anesthesia: spinal.  She presents to clinic today for pap smear and IUD insertion.  Postpartum course has been uncomplicated. Baby's course has been uncomplicated. Baby is feeding by started breast, but transitioned to bottle feeding with [facility] [employer]. Bleeding now back to normal period, on last day of her period today. Bowel function is normal. Bladder function is normal.   Patient is sexually active. Last unprotected sex 2 months ago.  Contraception method is condoms.  She would like to get a Mirena IUD placed today.  Postpartum depression screening: negative.  The following portions of the patient's history were reviewed and updated as appropriate: allergies, current medications, past family history, past medical history, past social history, past surgical history and problem list.  Review of Systems A comprehensive review of systems was negative.   Objective:    BP 125/88  Pulse 82  Temp(Src) 98.6 F (37 C)  Ht 5' 2.5" (1.588 m)  Wt 264 lb 4.8 oz (119.886 kg)  BMI 47.54 kg/m2  LMP 03/15/2014  General:  alert, cooperative and obese   Breasts:  inspection negative, no nipple discharge or bleeding, no masses or nodularity palpable  Lungs: clear to auscultation bilaterally  Heart:  regular rate and rhythm, S1, S2 normal, no murmur, click, rub or gallop  Abdomen: soft, non-tender; bowel sounds normal; no masses,  no organomegaly   Vulva:  normal  Vagina: normal vagina and scant amount of pooled blood  Cervix:  no cervical motion tenderness, no lesions and anterior on exam  Corpus: normal  Adnexa:  not evaluated  Rectal Exam: Not performed.        Assessment:    Routine gyn checkup, postpartum from RLTCS in  09/2013  Pap smear done at today's visit.   Plan:    1. Contraception: IUD, see procedure note below. 2. HM -- pap performed today 3. Follow up in: 1 year for annual  or as needed.    Attestation of Attending Supervision of Resident: Evaluation and management procedures were performed by the [facility] Res[facility] under my supervision.  I have seen and examined the patient, reviewed the resident's note and chart, and I agree with the management and plan.  Anibal Hendersonarolyn L Harraway-Smith, M.D. 03/17/2014 4:26 PM

## 2014-03-18 LAB — HIV ANTIBODY (ROUTINE TESTING W REFLEX): HIV: NONREACTIVE

## 2014-03-18 LAB — CYTOLOGY - PAP

## 2014-03-18 LAB — HEPATITIS PANEL, ACUTE
HCV Ab: NEGATIVE
HEP A IGM: NONREACTIVE
Hep B C IgM: NONREACTIVE
Hepatitis B Surface Ag: NEGATIVE

## 2014-03-18 LAB — RPR

## 2014-03-21 ENCOUNTER — Telehealth: Payer: Self-pay | Admitting: *Deleted

## 2014-03-21 NOTE — Telephone Encounter (Signed)
Pt left message requesting test results from visit on 6/25. I called her back and informed her of all lab results were negative (normal); HIV, Hepatitis, RPR, Pap, GC/Chlamydia.  Pt asked if she could get a copy of results when she returns to the clinic. I advised her that she will need to sign ROI and then can have a copy.  Pt voiced understanding.

## 2014-03-29 ENCOUNTER — Telehealth: Payer: Self-pay | Admitting: *Deleted

## 2014-03-29 DIAGNOSIS — N898 Other specified noninflammatory disorders of vagina: Secondary | ICD-10-CM

## 2014-03-29 NOTE — Telephone Encounter (Signed)
Pt left message statign that she recently had IUD placed. She is having a d/c and wants to know what to do.

## 2014-03-30 MED ORDER — METRONIDAZOLE 500 MG PO TABS: 500.0000 mg | ORAL_TABLET | Freq: Two times a day (BID) | ORAL | Status: DC

## 2014-03-30 NOTE — Telephone Encounter (Signed)
Called patient back she stated that she is having a thin discharge with a slightly fishy odor. Stated that she has been treated for BV before and this reminded her of that. Advised patient that I can send in Flagyl for her and if she continues to have symptoms after treatment she would need to be evaluated. Patient agrees,.

## 2014-04-14 ENCOUNTER — Encounter: Payer: Self-pay | Admitting: Obstetrics & Gynecology

## 2014-04-14 ENCOUNTER — Ambulatory Visit (INDEPENDENT_AMBULATORY_CARE_PROVIDER_SITE_OTHER): Payer: Medicaid Other | Admitting: Obstetrics & Gynecology

## 2014-04-14 VITALS — BP 104/64 | HR 69 | Temp 98.6°F | Ht 62.5 in | Wt 266.1 lb

## 2014-04-14 DIAGNOSIS — Z30431 Encounter for routine checking of intrauterine contraceptive device: Secondary | ICD-10-CM

## 2014-04-14 NOTE — Patient Instructions (Signed)
Intrauterine Device Insertion, Care After Refer to this sheet in the next few weeks. These instructions provide you with information on caring for yourself after your procedure. Your health care provider may also give you more specific instructions. Your treatment has been planned according to current medical practices, but problems sometimes occur. Call your health care provider if you have any problems or questions after your procedure. WHAT TO EXPECT AFTER THE PROCEDURE Insertion of the IUD may cause some discomfort, such as cramping. The cramping should improve after the IUD is in place. You may have bleeding after the procedure. This is normal. It varies from light spotting for a few days to menstrual-like bleeding. When the IUD is in place, a string will extend past the cervix into the vagina for 1-2 inches. The strings should not bother you or your partner. If they do, talk to your health care provider.  HOME CARE INSTRUCTIONS   Check your intrauterine device (IUD) to make sure it is in place before you resume sexual activity. You should be able to feel the strings. If you cannot feel the strings, something may be wrong. The IUD may have fallen out of the uterus, or the uterus may have been punctured (perforated) during placement. Also, if the strings are getting longer, it may mean that the IUD is being forced out of the uterus. You no longer have full protection from pregnancy if any of these problems occur.  You may resume sexual intercourse if you are not having problems with the IUD. The copper IUD is considered immediately effective, and the hormone IUD works right away if inserted within 7 days of your period starting. You will need to use a backup method of birth control for 7 days if the IUD in inserted at any other time in your cycle.  Continue to check that the IUD is still in place by feeling for the strings after every menstrual period.  You may need to take pain medicine such as  acetaminophen or ibuprofen. Only take medicines as directed by your health care provider. SEEK MEDICAL CARE IF:   You have bleeding that is heavier or lasts longer than a normal menstrual cycle.  You have a fever.  You have increasing cramps or abdominal pain not relieved with medicine.  You have abdominal pain that does not seem to be related to the same area of earlier cramping and pain.  You are lightheaded, unusually weak, or faint.  You have abnormal vaginal discharge or smells.  You have pain during sexual intercourse.  You cannot feel the IUD strings, or the IUD string has gotten longer.  You feel the IUD at the opening of the cervix in the vagina.  You think you are pregnant, or you miss your menstrual period.  The IUD string is hurting your sex partner. MAKE SURE YOU:  Understand these instructions.  Will watch your condition.  Will get help right away if you are not doing well or get worse. Document Released: 05/08/2011 Document Revised: 06/30/2013 Document Reviewed: 02/28/2013 ExitCare Patient Information 2015 [employer]. This information is not intended to replace advice given to you by your health care provider. Make sure you discuss any questions you have with your health care provider. Levonorgestrel intrauterine device (IUD) What is this medicine? LEVONORGESTREL IUD (LEE voe nor jes trel) is a contraceptive (birth control) device. The device is placed inside the uterus by a healthcare professional. It is used to prevent pregnancy and can also be used to   treat heavy bleeding that occurs during your period. Depending on the device, it can be used for 3 to 5 years. This medicine may be used for other purposes; ask your health care provider or pharmacist if you have questions. COMMON BRAND NAME(S): LILETTA, Mirena, Skyla What should I tell my health care provider before I take this medicine? They need to know if you have any of these conditions: -abnormal Pap  smear -cancer of the breast, uterus, or cervix -diabetes -endometritis -genital or pelvic infection now or in the past -have more than one sexual partner or your partner has more than one partner -heart disease -history of an ectopic or tubal pregnancy -immune system problems -IUD in place -liver disease or tumor -problems with blood clots or take blood-thinners -use intravenous drugs -uterus of unusual shape -vaginal bleeding that has not been explained -an unusual or allergic reaction to levonorgestrel, other hormones, silicone, or polyethylene, medicines, foods, dyes, or preservatives -pregnant or trying to get pregnant -breast-feeding How should I use this medicine? This device is placed inside the uterus by a health care professional. Talk to your pediatrician regarding the use of this medicine in children. Special care may be needed. Overdosage: If you think you have taken too much of this medicine contact a poison control center or emergency room at once. NOTE: This medicine is only for you. Do not share this medicine with others. What if I miss a dose? This does not apply. What may interact with this medicine? Do not take this medicine with any of the following medications: -amprenavir -bosentan -fosamprenavir This medicine may also interact with the following medications: -aprepitant -barbiturate medicines for inducing sleep or treating seizures -bexarotene -griseofulvin -medicines to treat seizures like carbamazepine, ethotoin, felbamate, oxcarbazepine, phenytoin, topiramate -modafinil -pioglitazone -rifabutin -rifampin -rifapentine -some medicines to treat HIV infection like atazanavir, indinavir, lopinavir, nelfinavir, tipranavir, ritonavir -St. John's wort -warfarin This list may not describe all possible interactions. Give your health care provider a list of all the medicines, herbs, non-prescription drugs, or dietary supplements you use. Also tell them if  you smoke, drink alcohol, or use illegal drugs. Some items may interact with your medicine. What should I watch for while using this medicine? Visit your doctor or health care professional for regular check ups. See your doctor if you or your partner has sexual contact with others, becomes HIV positive, or gets a sexual transmitted disease. This product does not protect you against HIV infection (AIDS) or other sexually transmitted diseases. You can check the placement of the IUD yourself by reaching up to the top of your vagina with clean fingers to feel the threads. Do not pull on the threads. It is a good habit to check placement after each menstrual period. Call your doctor right away if you feel more of the IUD than just the threads or if you cannot feel the threads at all. The IUD may come out by itself. You may become pregnant if the device comes out. If you notice that the IUD has come out use a backup birth control method like condoms and call your health care provider. Using tampons will not change the position of the IUD and are okay to use during your period. What side effects may I notice from receiving this medicine? Side effects that you should report to your doctor or health care professional as soon as possible: -allergic reactions like skin rash, itching or hives, swelling of the face, lips, or tongue -fever, flu-like symptoms -genital sores -  high blood pressure -no menstrual period for 6 weeks during use -pain, swelling, warmth in the leg -pelvic pain or tenderness -severe or sudden headache -signs of pregnancy -stomach cramping -sudden shortness of breath -trouble with balance, talking, or walking -unusual vaginal bleeding, discharge -yellowing of the eyes or skin Side effects that usually do not require medical attention (report to your doctor or health care professional if they continue or are bothersome): -acne -breast pain -change in sex drive or  performance -changes in weight -cramping, dizziness, or faintness while the device is being inserted -headache -irregular menstrual bleeding within first 3 to 6 months of use -nausea This list may not describe all possible side effects. Call your doctor for medical advice about side effects. You may report side effects to FDA at 1-800-FDA-1088. Where should I keep my medicine? This does not apply. NOTE: This sheet is a summary. It may not cover all possible information. If you have questions about this medicine, talk to your doctor, pharmacist, or health care provider.  2015, Elsevier/Gold Standard. (2011-10-10 13:54:04)  

## 2014-04-14 NOTE — Progress Notes (Signed)
Patient ID: Donna Hayes, female   DOB: [birth year], 28 y.o.   MRN: [id number] History:  28 y.o. [id number] here today for today for IUD string check; Mirena IUD was placed  02/2014. Pt reports spotting and cramping.  She reports that she understands that this is to be expected. The following portions of the patient's history were reviewed and updated as appropriate: allergies, current medications, past family history, past medical history, past social history, past surgical history and problem list.   Review of Systems:  Pertinent items are noted in HPI.  Objective:  Physical Exam Blood pressure 104/64, pulse 69, temperature 98.6 F (37 C), height 5' 2.5" (1.588 m), weight 266 lb 1.6 oz (120.702 kg), last menstrual period 04/07/2014, currently breastfeeding. Gen: NAD Abd: Soft, nontender and nondistended Pelvic: Normal appearing external genitalia; normal appearing vaginal mucosa and cervix.  IUD strings visualized, about 4 cm in length outside cervix.   Assessment & Plan:  Normal IUD check. Patient to keep IUD in place for five years; can come in for removal if she desires pregnancy within the next five years. Routine preventative health maintenance measures emphasized.

## 2014-04-15 ENCOUNTER — Encounter (HOSPITAL_BASED_OUTPATIENT_CLINIC_OR_DEPARTMENT_OTHER): Payer: Self-pay | Admitting: Emergency Medicine

## 2014-04-15 ENCOUNTER — Emergency Department (HOSPITAL_BASED_OUTPATIENT_CLINIC_OR_DEPARTMENT_OTHER)
Admission: EM | Admit: 2014-04-15 | Discharge: 2014-04-15 | Disposition: A | Discharge status: Home / Self Care | Payer: Medicaid Other | Attending: Emergency Medicine | Admitting: Emergency Medicine

## 2014-04-15 DIAGNOSIS — S0990XA Unspecified injury of head, initial encounter: Secondary | ICD-10-CM | POA: Diagnosis not present

## 2014-04-15 DIAGNOSIS — Z9889 Other specified postprocedural states: Secondary | ICD-10-CM | POA: Diagnosis not present

## 2014-04-15 DIAGNOSIS — E669 Obesity, unspecified: Secondary | ICD-10-CM | POA: Insufficient documentation

## 2014-04-15 DIAGNOSIS — R011 Cardiac murmur, unspecified: Secondary | ICD-10-CM | POA: Insufficient documentation

## 2014-04-15 DIAGNOSIS — Z880 Allergy status to penicillin: Secondary | ICD-10-CM | POA: Diagnosis not present

## 2014-04-15 DIAGNOSIS — Y9289 Other specified places as the place of occurrence of the external cause: Secondary | ICD-10-CM | POA: Insufficient documentation

## 2014-04-15 DIAGNOSIS — Y9389 Activity, other specified: Secondary | ICD-10-CM | POA: Insufficient documentation

## 2014-04-15 DIAGNOSIS — W2209XA Striking against other stationary object, initial encounter: Secondary | ICD-10-CM | POA: Diagnosis not present

## 2014-04-15 NOTE — ED Notes (Signed)
Reports fall this morning with hematoma to occiput. HA throughout the day

## 2014-04-15 NOTE — ED Provider Notes (Signed)
CSN: [id number]     Arrival date & time 04/15/14  1840 History  This chart was scribed for Rolan Bucco, MD by Carl Best, ED Scribe. This patient was seen in room MH12/MH12 and the patient's care was started at 7:19 PM.      Chief Complaint  Patient presents with  . Fall   Patient is a 28 y.o. female presenting with fall. The history is provided by the patient. No language interpreter was used.  Fall Associated symptoms include headaches. Pertinent negatives include no chest pain, no abdominal pain and no shortness of breath.   HPI Comments: Donna Hayes is a 28 y.o. female who presents to the Emergency Department complaining of constant headaches that started today after the patient hit her head on a windowsill.  She states that she felt as though her nose was going to bleed after she hit her head but denies any episodes of epistaxis at the time of the incident.  she states that she had a bump on her head, which has decreased in size, after she fell.  The patient states that she did not lose consciousness when she hit her head but felt weak and stayed on the floor for a "little while".  The patient denies taking any medication for her symptoms.  She denies nausea, numbness in her arms and legs, gait problems, vomiting, and dizziness as associated symptoms.  She denies using anticoagulants but states that she takes multivitamins.  Her headache has improved since the fall.  Past Medical History  Diagnosis Date  . Heart murmur   . Obesity    Past Surgical History  Procedure Laterality Date  . Cesarean section    . Cervical biopsy  w/ loop electrode excision    . Cesarean section N/A 10/04/2013    Procedure: CESAREAN SECTION;  Surgeon: Reva Bores, MD;  Location: [facility];  Service: Obstetrics;  Laterality: N/A;   Family History  Problem Relation Age of Onset  . Diabetes Mother   . Lupus Mother   . Heart disease Mother    History  Substance Use Topics  . Smoking status: Never  Smoker   . Smokeless tobacco: Never Used  . Alcohol Use: No   OB History   Grav Para Term Preterm Abortions TAB SAB Ect Mult Living   6 2 2  4 2 2   2      Review of Systems  Constitutional: Negative for fever, chills, diaphoresis and fatigue.  HENT: Negative for congestion, rhinorrhea and sneezing.   Eyes: Negative.   Respiratory: Negative for cough, chest tightness and shortness of breath.   Cardiovascular: Negative for chest pain and leg swelling.  Gastrointestinal: Negative for nausea, vomiting, abdominal pain, diarrhea and blood in stool.  Genitourinary: Negative for frequency, hematuria, flank pain and difficulty urinating.  Musculoskeletal: Negative for arthralgias, back pain, gait problem and neck pain.  Skin: Negative for rash.  Neurological: Positive for headaches. Negative for dizziness, speech difficulty, weakness and numbness.  All other systems reviewed and are negative.     Allergies  Penicillins  Home Medications   Prior to Admission medications   Medication Sig Start Date End Date Taking? Authorizing Provider  levonorgestrel (MIRENA) 20 MCG/24HR IUD 1 each by Intrauterine route once.    Historical Provider, MD  Multiple Vitamins-Minerals (HAIR/SKIN/NAILS) TABS Take 2 tablets by mouth daily.    Historical Provider, MD  Prenatal Vit-Fe Fumarate-FA (MULTIVITAMIN-PRENATAL) 27-0.8 MG TABS tablet Take 1 tablet by mouth daily at 12 noon.  Historical Provider, MD   Triage Vitals: BP 159/90  Pulse 86  Temp(Src) 98.1 F (36.7 C)  Ht 5' 2.5" (1.588 m)  Wt 262 lb (118.842 kg)  BMI 47.13 kg/m2  SpO2 100%  LMP 04/07/2014  Physical Exam  Constitutional: She is oriented to person, place, and time. She appears well-developed and well-nourished.  HENT:  Head: Normocephalic and atraumatic.  Some tenderness on palpation of the right posterior scalp but no underlying hematoma or wounds are noted. No hemotympanum  Eyes: Pupils are equal, round, and reactive to light.   Neck: Normal range of motion. Neck supple.  No pain to the cervical thoracic or lumbosacral spine  Cardiovascular: Normal rate, regular rhythm and normal heart sounds.   Pulmonary/Chest: Effort normal and breath sounds normal. No respiratory distress. She has no wheezes. She has no rales. She exhibits no tenderness.  Abdominal: Soft. Bowel sounds are normal. There is no tenderness. There is no rebound and no guarding.  Musculoskeletal: Normal range of motion. She exhibits no edema.  Lymphadenopathy:    She has no cervical adenopathy.  Neurological: She is alert and oriented to person, place, and time. She has normal strength. No sensory deficit.  Gait normal  Skin: Skin is warm and dry. No rash noted.  Psychiatric: She has a normal mood and affect.    ED Course  Procedures (including critical care time)  DIAGNOSTIC STUDIES: Oxygen Saturation is 100% on room air, normal by my interpretation.    COORDINATION OF CARE: 7:24 PM- Advised the patient that her symptoms are not concerning enough to warrant a CT scan.  Discussed discharging the patient and advised her to return if her symptoms worsen.  The patient agreed to the treatment plan.    Labs Review Labs Reviewed - No data to display  Imaging Review No results found.   EKG Interpretation None      MDM   Final diagnoses:  Head injury, initial encounter    Patient presents after a minor head injury this morning. Her headache seems to be improving. She has no other symptoms that would warrant CT imaging. She was given head injury precautions and advised to return for any worsening symptoms.    Rolan BuccoMelanie Rhiannon Sassaman, MD 04/15/14 2008

## 2014-04-15 NOTE — Discharge Instructions (Signed)

## 2014-05-12 ENCOUNTER — Encounter: Payer: Self-pay | Admitting: Vascular Surgery

## 2014-05-13 ENCOUNTER — Encounter: Payer: Self-pay | Admitting: Vascular Surgery

## 2014-05-13 ENCOUNTER — Ambulatory Visit (HOSPITAL_COMMUNITY)
Admission: RE | Admit: 2014-05-13 | Discharge: 2014-05-13 | Disposition: A | Discharge status: Home / Self Care | Payer: Medicaid Other | Source: Ambulatory Visit | Attending: Vascular Surgery | Admitting: Vascular Surgery

## 2014-05-13 ENCOUNTER — Ambulatory Visit (INDEPENDENT_AMBULATORY_CARE_PROVIDER_SITE_OTHER): Payer: Medicaid Other | Admitting: Vascular Surgery

## 2014-05-13 VITALS — BP 127/74 | HR 60 | Resp 16 | Ht 62.5 in | Wt 267.0 lb

## 2014-05-13 DIAGNOSIS — I83893 Varicose veins of bilateral lower extremities with other complications: Secondary | ICD-10-CM | POA: Insufficient documentation

## 2014-05-13 NOTE — Progress Notes (Signed)
Referred by:  Donna BoysMonica Carter, MD [id number] [place] [id number]  Reason for referral: Swollen painful legs bilaterally  History of Present Illness  Donna DossMykiesha Hayes is a 28 y.o. ([birth year]) female who presents with chief complaint: swollen and painful legs bilaterally.  Patient notes, onset of swelling 8 years ago, associated with standing.  The patient's symptoms include: throbbing, heaviness and itching that is worse at the end of the day. She has [place] where he underwent a 3 month trial of medicinal grade compression stockings. She also had another 3 month trial [place]. She describes no relief with stocking use and actually reports progression of her symptoms.  The patient has had no history of DVT. She has recent history of pregnancy eight months ago, but denies her symptoms significantly worsening at that time. She has a prior history of varicose veins. She denies a  history of venous stasis ulcers,  history of  lymphedema and  history of skin changes in lower legs.  There is a  family history of venous disorders. Her uncle has varicose veins.   Past Medical History  Diagnosis Date  . Heart murmur   . Obesity     Past Surgical History  Procedure Laterality Date  . Cesarean section    . Cervical biopsy  w/ loop electrode excision    . Cesarean section N/A 10/04/2013    Procedure: CESAREAN SECTION;  Surgeon: Reva Boresanya S Pratt, MD;  Location: [facility];  Service: Obstetrics;  Laterality: N/A;    History   Social History  . Marital Status: Single    Spouse Name: N/A    Number of Children: N/A  . Years of Education: N/A   Occupational History  . Not on file.   Social History Main Topics  . Smoking status: Never Smoker   . Smokeless tobacco: Never Used  . Alcohol Use: No  . Drug Use: No  . Sexual Activity: Yes    Birth Control/ Protection: IUD, Condom   Other Topics Concern  .  Not on file   Social History Narrative  . No narrative on file    Family History  Problem Relation Age of Onset  . Diabetes Mother   . Lupus Mother   . Heart disease Mother    Uncle with varicose veins.   Current Outpatient Prescriptions on File Prior to Visit  Medication Sig Dispense Refill  . levonorgestrel (MIRENA) 20 MCG/24HR IUD 1 each by Intrauterine route once.      . Multiple Vitamins-Minerals (HAIR/SKIN/NAILS) TABS Take 2 tablets by mouth daily.      . Prenatal Vit-Fe Fumarate-FA (MULTIVITAMIN-PRENATAL) 27-0.8 MG TABS tablet Take 1 tablet by mouth daily at 12 noon.       No current facility-administered medications on file prior to visit.    Allergies  Allergen Reactions  . Penicillins Hives    REVIEW OF SYSTEMS:  (Positives checked otherwise negative)  CARDIOVASCULAR:  []  chest pain, []  chest pressure, []  palpitations, [x]  shortness of breath when laying flat, []  shortness of breath with exertion,  []  pain in feet when walking, [x]  pain in feet when laying flat, []  history of blood clot in veins (DVT), []  history of phlebitis, []  swelling in legs, [x]  varicose veins  PULMONARY:  []  productive cough, []  asthma, []  wheezing  NEUROLOGIC:  [x]  weakness in arms or legs, []  numbness in arms or legs, []  difficulty speaking  or slurred speech, []  temporary loss of vision in one eye, []  dizziness  HEMATOLOGIC:  []  bleeding problems, []  problems with blood clotting too easily  MUSCULOSKEL:  []  joint pain, []  joint swelling  GASTROINTEST:  []  vomiting blood, []  blood in stool     GENITOURINARY:  []  burning with urination, []  blood in urine  PSYCHIATRIC:  []  history of major depression  INTEGUMENTARY:  []  rashes, []  ulcers  CONSTITUTIONAL:  []  fever, []  chills   Physical Examination Filed Vitals:   05/13/14 1443  BP: 127/74  Pulse: 60  Resp: 16  Height: 5' 2.5" (1.588 m)  Weight: 267 lb (121.11 kg)  SpO2: 100%   Body mass index is 48.03 kg/(m^2).  General:  A&O x 3, WD obese female in NAD  Head: [place]/AT  Eyes: PERRLA, EOMI  Neck: Supple, no nuchal rigidity  Pulmonary: Sym exp, good air movt, CTAB, no rales, rhonchi, & wheezing  Cardiac: RRR, Nl S1, S2, no Murmurs, rubs or gallops, without carotid bruits  Vascular: Vessel Right Left  Radial Palpable Palpable  Aorta Not palpable N/A  PT Palpable Palpable  DP Palpable Palpable   Gastrointestinal: soft, NTND, -G/R, - HSM, - masses, - CVAT   Musculoskeletal: M/S 5/5 throughout. Extremities without ischemic changes. 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 No hemosiderin changes. No lower extremity edema.   Neurologic: CN 2-12 intact. Pain and light touch intact in extremities  Psychiatric: Judgment intact, Mood & affect appropriate for pt's clinical situation  Dermatologic: See M/S exam for extremity exam, no rashes otherwise noted. No ulcers.    Non-Invasive Vascular Imaging  BLE Venous Insufficiency Duplex (Date: 05/13/2014):   RLE: negative DVT and SVT, negative GSV reflux, negative deep venous reflux  LLE: negative DVT and SVT, positive segmental GSV reflux, negative deep venous reflux; GSV diameters 0.34-0.64 cm.   Outside Studies/Documentation 4 pages of outside documents were reviewed including: medical record and lower venous duplex from [facility]   Medical Decision Making  [name] is a 28 y.o. female who presents with: BLE chronic venous insufficiency. She has significant varicosities bilaterally. She has had two prior three month trials of medicinal grade 20-30 mm thigh high compression stockings with no relief of her symptoms.  She is a candidate for stab phlebectomy and sclerotherapy procedures. She will follow up with Dr. Arbie Cookey or Dr. Hart Rochester to discuss these options.   Maris Berger, PA-C [facility] Office: [phone] Pager:  [phone]  05/13/2014, 3:21 PM  This patient was seen in conjunction with Dr. Imogene Burn.   Addendum  I have independently interviewed and examined the patient, and I agree with the physician assistant's findings.  Trial of compressive therapy has been completed by two other practices.  Pt continues to comply with compression stocking use.  Based prior interventions, the current venous insufficiency suggests limited benefit for GSV ablation, but given the distribution of nests of varicosities, she likely will need treatment with a combination of sclerotherapy and stab phlebectomy.  She will referred to [facility], next available appointment.  Leonides Sake, MD [facility] Office: [phone] Pager: [phone]  05/13/2014, 3:48 PM

## 2014-05-17 ENCOUNTER — Encounter: Payer: Self-pay | Admitting: General Practice

## 2014-05-27 ENCOUNTER — Encounter: Payer: Self-pay | Admitting: Vascular Surgery

## 2014-05-31 ENCOUNTER — Ambulatory Visit (INDEPENDENT_AMBULATORY_CARE_PROVIDER_SITE_OTHER): Payer: Medicaid Other | Admitting: Vascular Surgery

## 2014-05-31 ENCOUNTER — Encounter: Payer: Self-pay | Admitting: Vascular Surgery

## 2014-05-31 VITALS — BP 121/72 | HR 72 | Resp 18 | Ht 62.5 in | Wt 266.6 lb

## 2014-05-31 DIAGNOSIS — I83893 Varicose veins of bilateral lower extremities with other complications: Secondary | ICD-10-CM

## 2014-05-31 NOTE — Progress Notes (Signed)
Here today for followup of lower extremity venous pathology. She had seen Dr. Imogene Burn several weeks ago. At that time showed formal venous duplex showing no significant reflux bilaterally. Her major concern continues to be of prominent telangiectasia over both sides. She is morbidly obese and these are affecting her with discomfort over these areas specifically. She does have rather large plexi use of telangiectasia which are raised above the surface of the skin. She did have prior sclerotherapy treatment in [place] over plexus over her left lateral calf. She does have significant staining in this area from this treatment.  I reviewed her venous duplex showed no significant reflux in the deep or superficial system.  Physical exam she has palpable pedal pulses. She is on any varicosities. 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  I discussed this at length with the patient. She is quite frustrated that no good treatment options are available. I did explain that portion as we are not able to demonstrate any significant reflux and no dilatation of her saphenous veins bilaterally. She states that some of her outlying office suggested reflux and the possibility of laser ablation. I explained that this would give her minimal improvement since her pledget pager on her thighs and we could not demonstrate enlargement or reflux in her saphenous vein. I did explain the option of telangiectasia injection. I am concerned that she might have the same issue with standing but she did have with her prior treatment. I think this would be out-of-pocket expense since this would be considered cosmetic. She will notify us if she wished to proceed with sclerotherapy treatment

## 2014-06-03 ENCOUNTER — Other Ambulatory Visit (HOSPITAL_COMMUNITY): Payer: Self-pay | Admitting: Family Medicine

## 2014-07-18 ENCOUNTER — Telehealth: Payer: Self-pay | Admitting: *Deleted

## 2014-07-18 NOTE — Telephone Encounter (Signed)
Pt called about advice concerning birth control  Attempted to contact patient, no answer, left a message for patient to call clinic.

## 2014-07-19 NOTE — Telephone Encounter (Signed)
Called [place] and left a message we are calling you back for the second time, but keep missing you. Please call back if you still have questions.

## 2014-07-25 ENCOUNTER — Encounter: Payer: Self-pay | Admitting: Vascular Surgery

## 2014-10-10 ENCOUNTER — Ambulatory Visit: Payer: Medicaid Other | Admitting: Obstetrics and Gynecology

## 2014-10-10 ENCOUNTER — Telehealth: Payer: Self-pay | Admitting: *Deleted

## 2014-10-10 DIAGNOSIS — N898 Other specified noninflammatory disorders of vagina: Secondary | ICD-10-CM

## 2014-10-10 MED ORDER — FLUCONAZOLE 150 MG PO TABS: 150.0000 mg | ORAL_TABLET | Freq: Once | ORAL | Status: DC

## 2014-10-10 NOTE — Telephone Encounter (Signed)
Donna Hayes missed a scheduled appointment for std treatment. [facility] she states [facility] forgot, she thought since it was a holiday nothing would be open.  We discussed I do not see that she has has std testing recently and nothing to be treated. She states she thinks she has a yeast infection because she is having vaginal itching and discharge like she had when she yeast before in pregnancy.  We discussed a yeast infection is not a STD and I can send treatment in for that to her pharmacy, but if that doesn't clear up her symptoms she needs to call back and make an appointment to be seen. She voices understanding.

## 2014-12-27 ENCOUNTER — Telehealth: Payer: Self-pay | Admitting: *Deleted

## 2014-12-27 MED ORDER — METRONIDAZOLE 500 MG PO TABS: 500.0000 mg | ORAL_TABLET | Freq: Two times a day (BID) | ORAL | Status: DC

## 2014-12-27 NOTE — Telephone Encounter (Addendum)
Pt left message requesting Rx for BV because she thinks she has sx. She uses [facility] on [address] Please call back. I returned pt's call and discussed her concern. She stated that she has a vaginal discharge with odor and she has had BV in the past. I stated that I will send Rx to her pharmacy. If she is still having sx after the medication is completed, she will need to call the office for an appt to be seen by provider. Pt agreed and voiced understanding.

## 2015-03-11 ENCOUNTER — Emergency Department (HOSPITAL_BASED_OUTPATIENT_CLINIC_OR_DEPARTMENT_OTHER): Payer: Medicaid Other

## 2015-03-11 ENCOUNTER — Emergency Department (HOSPITAL_BASED_OUTPATIENT_CLINIC_OR_DEPARTMENT_OTHER)
Admission: EM | Admit: 2015-03-11 | Discharge: 2015-03-12 | Disposition: A | Discharge status: Home / Self Care | Payer: Medicaid Other | Attending: Emergency Medicine | Admitting: Emergency Medicine

## 2015-03-11 ENCOUNTER — Encounter (HOSPITAL_BASED_OUTPATIENT_CLINIC_OR_DEPARTMENT_OTHER): Payer: Self-pay

## 2015-03-11 DIAGNOSIS — R1011 Right upper quadrant pain: Secondary | ICD-10-CM | POA: Diagnosis not present

## 2015-03-11 DIAGNOSIS — E669 Obesity, unspecified: Secondary | ICD-10-CM | POA: Insufficient documentation

## 2015-03-11 DIAGNOSIS — R011 Cardiac murmur, unspecified: Secondary | ICD-10-CM | POA: Insufficient documentation

## 2015-03-11 DIAGNOSIS — R1033 Periumbilical pain: Secondary | ICD-10-CM | POA: Diagnosis present

## 2015-03-11 DIAGNOSIS — R109 Unspecified abdominal pain: Secondary | ICD-10-CM

## 2015-03-11 DIAGNOSIS — Z880 Allergy status to penicillin: Secondary | ICD-10-CM | POA: Diagnosis not present

## 2015-03-11 DIAGNOSIS — R1013 Epigastric pain: Secondary | ICD-10-CM | POA: Diagnosis not present

## 2015-03-11 DIAGNOSIS — Z3202 Encounter for pregnancy test, result negative: Secondary | ICD-10-CM | POA: Diagnosis not present

## 2015-03-11 LAB — URINALYSIS, ROUTINE W REFLEX MICROSCOPIC
Bilirubin Urine: NEGATIVE
GLUCOSE, UA: NEGATIVE mg/dL
Hgb urine dipstick: NEGATIVE
KETONES UR: NEGATIVE mg/dL
Nitrite: NEGATIVE
PH: 6 (ref 5.0–8.0)
Protein, ur: NEGATIVE mg/dL
Specific Gravity, Urine: 1.012 (ref 1.005–1.030)
Urobilinogen, UA: 0.2 mg/dL (ref 0.0–1.0)

## 2015-03-11 LAB — CBC WITH DIFFERENTIAL/PLATELET
Basophils Absolute: 0 10*3/uL (ref 0.0–0.1)
Basophils Relative: 0 % (ref 0–1)
EOS PCT: 0 % (ref 0–5)
Eosinophils Absolute: 0 10*3/uL (ref 0.0–0.7)
HEMATOCRIT: 35.5 % — AB (ref 36.0–46.0)
HEMOGLOBIN: 12.1 g/dL (ref 12.0–15.0)
Lymphocytes Relative: 18 % (ref 12–46)
Lymphs Abs: 1.9 10*3/uL (ref 0.7–4.0)
MCH: 27.8 pg (ref 26.0–34.0)
MCHC: 34.1 g/dL (ref 30.0–36.0)
MCV: 81.4 fL (ref 78.0–100.0)
MONO ABS: 0.6 10*3/uL (ref 0.1–1.0)
Monocytes Relative: 6 % (ref 3–12)
NEUTROS ABS: 8.4 10*3/uL — AB (ref 1.7–7.7)
Neutrophils Relative %: 76 % (ref 43–77)
Platelets: 291 10*3/uL (ref 150–400)
RBC: 4.36 MIL/uL (ref 3.87–5.11)
RDW: 14.6 % (ref 11.5–15.5)
WBC: 10.9 10*3/uL — ABNORMAL HIGH (ref 4.0–10.5)

## 2015-03-11 LAB — COMPREHENSIVE METABOLIC PANEL
ALT: 14 U/L (ref 14–54)
AST: 17 U/L (ref 15–41)
Albumin: 3.5 g/dL (ref 3.5–5.0)
Alkaline Phosphatase: 59 U/L (ref 38–126)
Anion gap: 7 (ref 5–15)
BUN: 14 mg/dL (ref 6–20)
CO2: 28 mmol/L (ref 22–32)
CREATININE: 0.92 mg/dL (ref 0.44–1.00)
Calcium: 8.7 mg/dL — ABNORMAL LOW (ref 8.9–10.3)
Chloride: 103 mmol/L (ref 101–111)
GLUCOSE: 98 mg/dL (ref 65–99)
POTASSIUM: 3.8 mmol/L (ref 3.5–5.1)
Sodium: 138 mmol/L (ref 135–145)
TOTAL PROTEIN: 7.4 g/dL (ref 6.5–8.1)
Total Bilirubin: 0.3 mg/dL (ref 0.3–1.2)

## 2015-03-11 LAB — URINE MICROSCOPIC-ADD ON

## 2015-03-11 LAB — LIPASE, BLOOD: Lipase: 12 U/L — ABNORMAL LOW (ref 22–51)

## 2015-03-11 LAB — PREGNANCY, URINE: Preg Test, Ur: NEGATIVE

## 2015-03-11 MED ORDER — IOHEXOL 300 MG/ML  SOLN
100.0000 mL | Freq: Once | INTRAMUSCULAR | Status: AC | PRN
  Administered 2015-03-11: 100 mL via INTRAVENOUS

## 2015-03-11 MED ORDER — HYDROCODONE-ACETAMINOPHEN 5-325 MG PO TABS: 1.0000 | ORAL_TABLET | ORAL | Status: DC | PRN

## 2015-03-11 MED ORDER — ONDANSETRON HCL 4 MG/2ML IJ SOLN
4.0000 mg | Freq: Once | INTRAMUSCULAR | Status: AC
  Administered 2015-03-11: 4 mg via INTRAVENOUS
  Filled 2015-03-11: qty 2

## 2015-03-11 MED ORDER — IOHEXOL 300 MG/ML  SOLN
50.0000 mL | Freq: Once | INTRAMUSCULAR | Status: AC | PRN
  Administered 2015-03-11: 50 mL via ORAL

## 2015-03-11 MED ORDER — ONDANSETRON 4 MG PO TBDP: 4.0000 mg | ORAL_TABLET | Freq: Three times a day (TID) | ORAL | Status: DC | PRN

## 2015-03-11 MED ORDER — MORPHINE SULFATE 4 MG/ML IJ SOLN
4.0000 mg | Freq: Once | INTRAMUSCULAR | Status: AC
  Administered 2015-03-11: 4 mg via INTRAVENOUS
  Filled 2015-03-11: qty 1

## 2015-03-11 NOTE — ED Provider Notes (Signed)
CSN: [id number]     Arrival date & time 03/11/15  1841 History   First MD Initiated Contact with Patient 03/11/15 1855     Chief Complaint  Patient presents with  . Abdominal Pain     (Consider location/radiation/quality/duration/timing/severity/associated sxs/prior Treatment) HPI Comments: Pt comes in with c/o periumbilical pain that started about 1.5 hours after eating earlier today. Some nausea no vomiting. May have some urinary frequency. No vaginal discharge however she states that she has finished her period today. No fever. Nothing makes the symptoms better or worse. bm yesterday was normal  The history is provided by the patient. No language interpreter was used.    Past Medical History  Diagnosis Date  . Heart murmur   . Obesity    Past Surgical History  Procedure Laterality Date  . Cesarean section    . Cervical biopsy  w/ loop electrode excision    . Cesarean section N/A 10/04/2013    Procedure: CESAREAN SECTION;  Surgeon: Reva Bores, MD;  Location: [facility];  Service: Obstetrics;  Laterality: N/A;   Family History  Problem Relation Age of Onset  . Diabetes Mother   . Lupus Mother   . Heart disease Mother    History  Substance Use Topics  . Smoking status: Never Smoker   . Smokeless tobacco: Never Used  . Alcohol Use: No   OB History    Gravida Para Term Preterm AB TAB SAB Ectopic Multiple Living   6 2 2  4 2 2   2      Review of Systems  All other systems reviewed and are negative.     Allergies  Penicillins  Home Medications   Prior to Admission medications   Not on File   BP 124/80 mmHg  Pulse 101  Temp(Src) 98.7 F (37.1 C) (Oral)  Resp 20  Ht 5\' 2"  (1.575 m)  Wt 270 lb (122.471 kg)  BMI 49.37 kg/m2  SpO2 100%  LMP 03/05/2015 Physical Exam  Constitutional: She is oriented to person, place, and time. She appears well-developed and well-nourished.  HENT:  Head: Normocephalic and atraumatic.  Cardiovascular: Normal rate and regular  rhythm.   Pulmonary/Chest: Effort normal and breath sounds normal.  Abdominal: Soft. Bowel sounds are normal. There is tenderness in the right upper quadrant and epigastric area.  Musculoskeletal: Normal range of motion.  Neurological: She is alert and oriented to person, place, and time. She exhibits normal muscle tone. Coordination normal.  Skin: Skin is warm.  Nursing note and vitals reviewed.   ED Course  Procedures (including critical care time) Labs Review Labs Reviewed  URINALYSIS, ROUTINE W REFLEX MICROSCOPIC (NOT AT [facility]) - Abnormal; Notable for the following:    Leukocytes, UA TRACE (*)    All other components within normal limits  URINE MICROSCOPIC-ADD ON - Abnormal; Notable for the following:    Bacteria, UA MANY (*)    All other components within normal limits  COMPREHENSIVE METABOLIC PANEL - Abnormal; Notable for the following:    Calcium 8.7 (*)    All other components within normal limits  LIPASE, BLOOD - Abnormal; Notable for the following:    Lipase 12 (*)    All other components within normal limits  CBC WITH DIFFERENTIAL/PLATELET - Abnormal; Notable for the following:    WBC 10.9 (*)    HCT 35.5 (*)    Neutro Abs 8.4 (*)    All other components within normal limits  URINE CULTURE  PREGNANCY, URINE  Imaging Review US Abdomen Complete  03/11/2015   CLINICAL DATA:  Midline upper and mid abdominal pain for the past day. The pain is worse after meals. Nausea and vomiting today.  EXAM: ULTRASOUND ABDOMEN COMPLETE  COMPARISON:  Abdomen and pelvis CT dated 05/01/2013.  FINDINGS: Gallbladder: No gallstones or wall thickening visualized. No sonographic Murphy sign noted.  Common bile duct: Diameter: 2.3 mm  Liver: No focal lesion identified. Within normal limits in parenchymal echogenicity.  IVC: No abnormality visualized.  Pancreas: Visualized portion unremarkable.  Spleen: Size and appearance within normal limits.  Right Kidney: Length: 11.7 cm. Echogenicity within  normal limits. No mass or hydronephrosis visualized.  Left Kidney: Length: 11.1 cm. Echogenicity within normal limits. No mass or hydronephrosis visualized.  Abdominal aorta: No aneurysm visualized.  Other findings: None.  IMPRESSION: Normal examination.   Electronically Signed   By: Beckie Salts M.D.   On: 03/11/2015 20:14   Ct Abdomen Pelvis W Contrast  03/11/2015   CLINICAL DATA:  Periumbilical abdominal pain with lower abdominal pain and nausea starting in early morning. No bowel movement today. Blood in urine.  EXAM: CT ABDOMEN AND PELVIS WITH CONTRAST  TECHNIQUE: Multidetector CT imaging of the abdomen and pelvis was performed using the standard protocol following bolus administration of intravenous contrast.  CONTRAST:  OMNIPAQUE IOHEXOL 300 MG/ML SOLN, 50mL OMNIPAQUE IOHEXOL 300 MG/ML SOLN  COMPARISON:  Ultrasound abdomen 03/11/2015. CT abdomen and pelvis 05/21/2013  FINDINGS: Mild dependent changes in the lung bases.  The liver, spleen, gallbladder, pancreas, adrenal glands, kidneys, abdominal aorta, inferior vena cava, and retroperitoneal lymph nodes are unremarkable. Stomach, small bowel, and colon are not abnormally distended. No free air or free fluid in the abdomen.  Pelvis: Appendix is normal. Uterus and ovaries are not enlarged. Bladder wall is not thickened. No free or loculated pelvic fluid collections. No pelvic mass or lymphadenopathy. No destructive bone lesions.  IMPRESSION: No acute abnormalities demonstrated in the abdomen or pelvis.   Electronically Signed   By: Burman Nieves M.D.   On: 03/11/2015 22:19     EKG Interpretation None      MDM   Final diagnoses:  Abdominal pain    No acute process noted on ct and [place]. Pt is feeling better with pain medication and zofran here. Will send home with zofran and hydrocodone for pain. No definite infection noted in urine    Teressa Lower, NP 03/11/15 2254  Glynn Octave, MD 03/11/15 2321

## 2015-03-11 NOTE — ED Notes (Signed)
Medicated for pain IV site estasblished. Pt reports mid to low abd pain that increases with certain movements

## 2015-03-11 NOTE — ED Notes (Signed)
Patient here with lower abdominal pain and nausea that started early am, reports no BM today. Thinks she noticed blood in her urine today.

## 2015-03-11 NOTE — Discharge Instructions (Signed)
Abdominal Pain, Women °Abdominal (stomach, pelvic, or belly) pain can be caused by many things. It is important to tell your doctor: °· The location of the pain. °· Does it come and go or is it present all the time? °· Are there things that start the pain (eating certain foods, exercise)? °· Are there other symptoms associated with the pain (fever, nausea, vomiting, diarrhea)? °All of this is helpful to know when trying to find the cause of the pain. °CAUSES  °· Stomach: virus or bacteria infection, or ulcer. °· Intestine: appendicitis (inflamed appendix), regional ileitis (Crohn's disease), ulcerative colitis (inflamed colon), irritable bowel syndrome, diverticulitis (inflamed diverticulum of the colon), or cancer of the stomach or intestine. °· Gallbladder disease or stones in the gallbladder. °· Kidney disease, kidney stones, or infection. °· Pancreas infection or cancer. °· Fibromyalgia (pain disorder). °· Diseases of the female organs: °¨ Uterus: fibroid (non-cancerous) tumors or infection. °¨ Fallopian tubes: infection or tubal pregnancy. °¨ Ovary: cysts or tumors. °¨ Pelvic adhesions (scar tissue). °¨ Endometriosis (uterus lining tissue growing in the pelvis and on the pelvic organs). °¨ Pelvic congestion syndrome (female organs filling up with blood just before the menstrual period). °¨ Pain with the menstrual period. °¨ Pain with ovulation (producing an egg). °¨ Pain with an IUD (intrauterine device, birth control) in the uterus. °¨ Cancer of the female organs. °· Functional pain (pain not caused by a disease, may improve without treatment). °· Psychological pain. °· Depression. °DIAGNOSIS  °Your doctor will decide the seriousness of your pain by doing an examination. °· Blood tests. °· X-rays. °· Ultrasound. °· CT scan (computed tomography, special type of X-ray). °· MRI (magnetic resonance imaging). °· Cultures, for infection. °· Barium enema (dye inserted in the large intestine, to better view it with  X-rays). °· Colonoscopy (looking in intestine with a lighted tube). °· Laparoscopy (minor surgery, looking in abdomen with a lighted tube). °· Major abdominal exploratory surgery (looking in abdomen with a large incision). °TREATMENT  °The treatment will depend on the cause of the pain.  °· Many cases can be observed and treated at home. °· Over-the-counter medicines recommended by your caregiver. °· Prescription medicine. °· Antibiotics, for infection. °· Birth control pills, for painful periods or for ovulation pain. °· Hormone treatment, for endometriosis. °· Nerve blocking injections. °· Physical therapy. °· Antidepressants. °· Counseling with a psychologist or psychiatrist. °· Minor or major surgery. °HOME CARE INSTRUCTIONS  °· Do not take laxatives, unless directed by your caregiver. °· Take over-the-counter pain medicine only if ordered by your caregiver. Do not take aspirin because it can cause an upset stomach or bleeding. °· Try a clear liquid diet (broth or water) as ordered by your caregiver. Slowly move to a bland diet, as tolerated, if the pain is related to the stomach or intestine. °· Have a thermometer and take your temperature several times a day, and record it. °· Bed rest and sleep, if it helps the pain. °· Avoid sexual intercourse, if it causes pain. °· Avoid stressful situations. °· Keep your follow-up appointments and tests, as your caregiver orders. °· If the pain does not go away with medicine or surgery, you may try: °¨ Acupuncture. °¨ Relaxation exercises (yoga, meditation). °¨ Group therapy. °¨ Counseling. °SEEK MEDICAL CARE IF:  °· You notice certain foods cause stomach pain. °· Your home care treatment is not helping your pain. °· You need stronger pain medicine. °· You want your IUD removed. °· You feel faint or   lightheaded. °· You develop nausea and vomiting. °· You develop a rash. °· You are having side effects or an allergy to your medicine. °SEEK IMMEDIATE MEDICAL CARE IF:  °· Your  pain does not go away or gets worse. °· You have a fever. °· Your pain is felt only in portions of the abdomen. The right side could possibly be appendicitis. The left lower portion of the abdomen could be colitis or diverticulitis. °· You are passing blood in your stools (bright red or black tarry stools, with or without vomiting). °· You have blood in your urine. °· You develop chills, with or without a fever. °· You pass out. °MAKE SURE YOU:  °· Understand these instructions. °· Will watch your condition. °· Will get help right away if you are not doing well or get worse. °Document Released: 07/07/2007 Document Revised: 01/24/2014 Document Reviewed: 07/27/2009 °Exit[employer]® Patient Information ©2015 [employer]. This information is not intended to replace advice given to you by your health care provider. Make sure you discuss any questions you have with your health care provider. ° °

## 2015-03-14 LAB — URINE CULTURE: Culture: >=100000

## 2015-03-15 ENCOUNTER — Telehealth (HOSPITAL_BASED_OUTPATIENT_CLINIC_OR_DEPARTMENT_OTHER): Payer: Self-pay | Admitting: Emergency Medicine

## 2015-03-15 NOTE — Progress Notes (Signed)
ED Antimicrobial Stewardship Positive Culture Follow Up   Donna Hayes is an 29 y.o. female who presented to [facility] on 03/11/2015 with a chief complaint of  Chief Complaint  Patient presents with  . Abdominal Pain    Recent Results (from the past 720 hour(s))  Urine culture     Status: None   Collection Time: 03/11/15  6:51 PM  Result Value Ref Range Status   Specimen Description URINE, CLEAN CATCH  Final   Special Requests NONE  Final   Culture   Final    >=100,000 COLONIES/mL CITROBACTER KOSERI Performed at [facility]    Report Status 03/14/2015 FINAL  Final   Organism ID, Bacteria CITROBACTER KOSERI  Final      Susceptibility   Citrobacter koseri - MIC*    CEFTAZIDIME <=1 SENSITIVE Sensitive     CEFTRIAXONE <=1 SENSITIVE Sensitive     CIPROFLOXACIN <=0.25 SENSITIVE Sensitive     GENTAMICIN <=1 SENSITIVE Sensitive     IMIPENEM <=0.25 SENSITIVE Sensitive     NITROFURANTOIN 32 SENSITIVE Sensitive     TRIMETH/SULFA <=20 SENSITIVE Sensitive     PIP/TAZO <=4 SENSITIVE Sensitive     * >=100,000 COLONIES/mL CITROBACTER KOSERI    [x]  Patient discharged originally without antimicrobial agent and treatment is now indicated  New antibiotic prescription: Bactrim DS 1 tab bid x 5 days  ED Provider: Danelle Berry, PA-C  Rolley Sims 03/15/2015, 12:35 PM Infectious Diseases Pharmacist Phone# [phone]

## 2015-03-27 ENCOUNTER — Telehealth (HOSPITAL_COMMUNITY): Payer: Self-pay

## 2015-03-27 NOTE — ED Notes (Signed)
Unable to contact pt by mail or telephone. Unable to communicate lab results or treatment changes. 

## 2015-12-01 ENCOUNTER — Encounter: Payer: Self-pay | Admitting: *Deleted

## 2015-12-05 ENCOUNTER — Ambulatory Visit: Payer: Medicaid Other | Admitting: *Deleted

## 2015-12-10 ENCOUNTER — Encounter (HOSPITAL_BASED_OUTPATIENT_CLINIC_OR_DEPARTMENT_OTHER): Payer: Self-pay

## 2015-12-10 ENCOUNTER — Emergency Department (HOSPITAL_BASED_OUTPATIENT_CLINIC_OR_DEPARTMENT_OTHER): Payer: Medicaid Other

## 2015-12-10 ENCOUNTER — Emergency Department (HOSPITAL_BASED_OUTPATIENT_CLINIC_OR_DEPARTMENT_OTHER)
Admission: EM | Admit: 2015-12-10 | Discharge: 2015-12-10 | Disposition: A | Discharge status: Home / Self Care | Payer: Medicaid Other | Attending: Emergency Medicine | Admitting: Emergency Medicine

## 2015-12-10 DIAGNOSIS — R197 Diarrhea, unspecified: Secondary | ICD-10-CM | POA: Insufficient documentation

## 2015-12-10 DIAGNOSIS — J069 Acute upper respiratory infection, unspecified: Secondary | ICD-10-CM | POA: Diagnosis not present

## 2015-12-10 DIAGNOSIS — R011 Cardiac murmur, unspecified: Secondary | ICD-10-CM | POA: Insufficient documentation

## 2015-12-10 DIAGNOSIS — E669 Obesity, unspecified: Secondary | ICD-10-CM | POA: Insufficient documentation

## 2015-12-10 DIAGNOSIS — R05 Cough: Secondary | ICD-10-CM | POA: Diagnosis present

## 2015-12-10 DIAGNOSIS — Z880 Allergy status to penicillin: Secondary | ICD-10-CM | POA: Insufficient documentation

## 2015-12-10 MED ORDER — IBUPROFEN 800 MG PO TABS: 800.0000 mg | ORAL_TABLET | Freq: Three times a day (TID) | ORAL | Status: DC

## 2015-12-10 MED ORDER — KETOROLAC TROMETHAMINE 60 MG/2ML IM SOLN
60.0000 mg | Freq: Once | INTRAMUSCULAR | Status: AC
  Administered 2015-12-10: 60 mg via INTRAMUSCULAR
  Filled 2015-12-10: qty 2

## 2015-12-10 MED ORDER — ONDANSETRON 4 MG PO TBDP: 4.0000 mg | ORAL_TABLET | Freq: Three times a day (TID) | ORAL | Status: DC | PRN

## 2015-12-10 MED ORDER — BENZONATATE 100 MG PO CAPS: 100.0000 mg | ORAL_CAPSULE | Freq: Three times a day (TID) | ORAL | Status: DC

## 2015-12-10 MED ORDER — BENZONATATE 100 MG PO CAPS
100.0000 mg | ORAL_CAPSULE | Freq: Once | ORAL | Status: AC
  Administered 2015-12-10: 100 mg via ORAL
  Filled 2015-12-10: qty 1

## 2015-12-10 NOTE — ED Provider Notes (Signed)
CSN: [id number]     Arrival date & time 12/10/15  7829 History   First MD Initiated Contact with Patient 12/10/15 [phone]     Chief Complaint  Patient presents with  . URI     (Consider location/radiation/quality/duration/timing/severity/associated sxs/prior Treatment) HPI   Donna Hayes is a 30 y.o. female, with a history of heart murmur and obesity, presenting to the ED with productive cough for the last two weeks. Pt also complains of diarrhea, sore throat, and intermittent chills. Pt has taken Tylenol Severe Cold, Alka Seltzer, Mucinex, and plain tylenol, all with minimal relief. Pt denies shortness of breath, chest pain, N/V, fever, or any other complaints.     Past Medical History  Diagnosis Date  . Heart murmur   . Obesity    Past Surgical History  Procedure Laterality Date  . Cesarean section    . Cervical biopsy  w/ loop electrode excision    . Cesarean section N/A 10/04/2013    Procedure: CESAREAN SECTION;  Surgeon: Reva Bores, MD;  Location: [facility];  Service: Obstetrics;  Laterality: N/A;   Family History  Problem Relation Age of Onset  . Diabetes Mother   . Lupus Mother   . Heart disease Mother    Social History  Substance Use Topics  . Smoking status: Never Smoker   . Smokeless tobacco: Never Used  . Alcohol Use: No   OB History    Gravida Para Term Preterm AB TAB SAB Ectopic Multiple Living   Review of Systems  Constitutional: Positive for chills. Negative for fever.  Respiratory: Positive for cough. Negative for shortness of breath.   Cardiovascular: Negative for chest pain.  Gastrointestinal: Positive for diarrhea. Negative for nausea, vomiting, abdominal pain and blood in stool.  Skin: Negative for color change and pallor.  Neurological: Negative for dizziness, light-headedness and headaches.  All other systems reviewed and are negative.     Allergies  Penicillins  Home Medications   Prior to Admission  medications   Medication Sig Start Date End Date Taking? Authorizing Provider  benzonatate (TESSALON) 100 MG capsule Take 1 capsule (100 mg total) by mouth every 8 (eight) hours. 12/10/15   Wetona Viramontes C Woodruff Skirvin, PA-C  HYDROcodone-acetaminophen (NORCO/VICODIN) 5-325 MG per tablet Take 1-2 tablets by mouth every 4 (four) hours as needed. 03/11/15   Teressa Lower, NP  ibuprofen (ADVIL,MOTRIN) 800 MG tablet Take 1 tablet (800 mg total) by mouth 3 (three) times daily. 12/10/15   Humberto Addo C Ladavia Lindenbaum, PA-C  ondansetron (ZOFRAN ODT) 4 MG disintegrating tablet Take 1 tablet (4 mg total) by mouth every 8 (eight) hours as needed for nausea or vomiting. 03/11/15   Teressa Lower, NP  ondansetron (ZOFRAN ODT) 4 MG disintegrating tablet Take 1 tablet (4 mg total) by mouth every 8 (eight) hours as needed for nausea or vomiting. 12/10/15   Adlynn Lowenstein C Jakie Debow, PA-C   BP 126/77 mmHg  Pulse 76  Temp(Src) 98 F (36.7 C) (Oral)  Resp 18  Ht  (1.575 m)  Wt 120.203 kg  BMI 48.46 kg/m2  SpO2 99%  LMP 11/14/2015 Physical Exam  Constitutional: She appears well-developed and well-nourished. No distress.  HENT:  Head: Normocephalic and atraumatic.  Eyes: Conjunctivae are normal. Pupils are equal, round, and reactive to light.  Neck: Neck supple.  Cardiovascular: Normal rate, regular rhythm, normal heart sounds and intact distal pulses.   Pulmonary/Chest: Effort normal and breath sounds  normal. No respiratory distress.  Abdominal: Soft. Bowel sounds are normal. There is no tenderness. There is no guarding.  Musculoskeletal: She exhibits no edema or tenderness.  Lymphadenopathy:    She has no cervical adenopathy.  Neurological: She is alert.  Skin: Skin is warm and dry. She is not diaphoretic.  Psychiatric: She has a normal mood and affect. Her behavior is normal.  Nursing note and vitals reviewed.   ED Course  Procedures (including critical care time) Labs Review Labs Reviewed - No data to display  Imaging Review Dg Chest  2 View  12/10/2015  CLINICAL DATA:  Productive cough.  Pharyngitis.  Fever. EXAM: CHEST  2 VIEW COMPARISON:  None. FINDINGS: The heart size and mediastinal contours are within normal limits. Both lungs are clear. The visualized skeletal structures are unremarkable. IMPRESSION: Normal examination. Electronically Signed   By: Beckie SaltsSteven  Reid M.D.   On: 12/10/2015 10:34   I have personally reviewed and evaluated these images as part of my medical decision-making.   EKG Interpretation None      MDM   Final diagnoses:  URI (upper respiratory infection)    Donna Hayes presents with cough, sore throat, chills, and diarrhea for the past 2 weeks.  This patient's presentation is consistent with a viral illness, such as influenza or viral bronchitis. Chest x-ray shows no abnormalities or suggestions of pneumonia. Symptomatic care and return precautions discussed. Patient to follow-up with the PCP should symptoms continue. Patient voices understanding of these instructions and is comfortable with discharge.  Filed Vitals:   12/10/15 0947  BP: 126/77  Pulse: 76  Temp: 98 F (36.7 C)  TempSrc: Oral  Resp: 18  Height: 5\' 2"  (1.575 m)  Weight: 120.203 kg  SpO2: 99%       Anselm PancoastShawn C Renay Crammer, PA-C 12/10/15 1039  Pricilla LovelessScott Goldston, MD 12/11/15 [phone]

## 2015-12-10 NOTE — ED Notes (Signed)
EDP at bedside discussing results with patient. 

## 2015-12-10 NOTE — ED Notes (Signed)
Pt reports cough and cold x 10 days. Reports having sick family recently. Has taken some tylenol, without relief. Also reports headaches as well.

## 2015-12-10 NOTE — Discharge Instructions (Signed)
You have been seen today for cough as well as other symptoms. Your imaging showed no abnormalities. Your symptoms are consistent with a viral illness. Viruses do not require antibiotics. Treatment is symptomatic care. Drink plenty of fluids and get plenty of rest. You should be drinking at least a liter of water an hour to stay hydrated. Ibuprofen or Tylenol for pain or fever. Zofran for nausea. Tessalon for cough. Plain Mucinex may help relieve congestion. Warm liquids or Chloraseptic spray may help soothe the sore throat. Follow up with PCP as needed. Return to ED should symptoms worsen.

## 2015-12-27 ENCOUNTER — Ambulatory Visit: Payer: Medicaid Other | Admitting: *Deleted

## 2016-01-19 ENCOUNTER — Encounter: Payer: Self-pay | Admitting: *Deleted

## 2016-01-24 ENCOUNTER — Ambulatory Visit: Payer: Medicaid Other | Admitting: *Deleted

## 2016-01-24 DIAGNOSIS — I781 Nevus, non-neoplastic: Secondary | ICD-10-CM

## 2016-01-24 NOTE — Progress Notes (Signed)
Did not treat the patient's spider veins [place]. She will reschedule in the fall.

## 2016-11-19 ENCOUNTER — Encounter: Payer: Self-pay | Admitting: *Deleted

## 2016-11-27 ENCOUNTER — Ambulatory Visit (INDEPENDENT_AMBULATORY_CARE_PROVIDER_SITE_OTHER): Payer: Self-pay | Admitting: *Deleted

## 2016-11-27 DIAGNOSIS — I781 Nevus, non-neoplastic: Secondary | ICD-10-CM

## 2016-11-27 NOTE — Progress Notes (Signed)
X=.3% Sotradecol administered with a 27g butterfly.  Patient received a total of 12cc.  This patient has numerous large areas of spider veins. No bulging varicose veins though or swelling. Her reflux study was nl. She will require lots of sclero. Treated ones today that are painful at bilat outer calves and the bulging ones on both outer thighs. The treatment was painful to her. Easy access. Follow prn,   Compression stockings applied: Yes.

## 2016-12-30 ENCOUNTER — Encounter: Payer: Self-pay | Admitting: *Deleted

## 2017-01-09 ENCOUNTER — Ambulatory Visit: Payer: Medicaid Other | Admitting: *Deleted

## 2017-03-12 ENCOUNTER — Encounter (INDEPENDENT_AMBULATORY_CARE_PROVIDER_SITE_OTHER): Payer: Medicaid Other

## 2017-03-12 DIAGNOSIS — I868 Varicose veins of other specified sites: Secondary | ICD-10-CM

## 2017-06-18 ENCOUNTER — Ambulatory Visit: Payer: Medicaid Other | Admitting: *Deleted

## 2017-06-25 ENCOUNTER — Ambulatory Visit: Payer: Medicaid Other | Admitting: *Deleted

## 2017-06-25 ENCOUNTER — Ambulatory Visit: Payer: Medicaid Other

## 2018-05-26 ENCOUNTER — Other Ambulatory Visit: Payer: Self-pay

## 2018-05-26 ENCOUNTER — Encounter (HOSPITAL_BASED_OUTPATIENT_CLINIC_OR_DEPARTMENT_OTHER): Payer: Self-pay

## 2018-05-26 ENCOUNTER — Emergency Department (HOSPITAL_BASED_OUTPATIENT_CLINIC_OR_DEPARTMENT_OTHER): Payer: Medicaid Other

## 2018-05-26 ENCOUNTER — Emergency Department (HOSPITAL_BASED_OUTPATIENT_CLINIC_OR_DEPARTMENT_OTHER)
Admission: EM | Admit: 2018-05-26 | Discharge: 2018-05-26 | Disposition: A | Discharge status: Home / Self Care | Payer: Medicaid Other | Attending: Emergency Medicine | Admitting: Emergency Medicine

## 2018-05-26 DIAGNOSIS — Z79899 Other long term (current) drug therapy: Secondary | ICD-10-CM | POA: Insufficient documentation

## 2018-05-26 DIAGNOSIS — M545 Low back pain, unspecified: Secondary | ICD-10-CM

## 2018-05-26 DIAGNOSIS — M62830 Muscle spasm of back: Secondary | ICD-10-CM | POA: Diagnosis not present

## 2018-05-26 LAB — URINALYSIS, ROUTINE W REFLEX MICROSCOPIC
BILIRUBIN URINE: NEGATIVE
GLUCOSE, UA: NEGATIVE mg/dL
Hgb urine dipstick: NEGATIVE
Ketones, ur: NEGATIVE mg/dL
Leukocytes, UA: NEGATIVE
Nitrite: NEGATIVE
Protein, ur: NEGATIVE mg/dL
Specific Gravity, Urine: 1.01 (ref 1.005–1.030)
pH: 6.5 (ref 5.0–8.0)

## 2018-05-26 LAB — PREGNANCY, URINE: PREG TEST UR: NEGATIVE

## 2018-05-26 MED ORDER — NAPROXEN 500 MG PO TABS: 500.0000 mg | ORAL_TABLET | Freq: Two times a day (BID) | ORAL | 0 refills | Status: DC

## 2018-05-26 MED ORDER — ACETAMINOPHEN 500 MG PO TABS
1000.0000 mg | ORAL_TABLET | Freq: Once | ORAL | Status: AC
  Administered 2018-05-26: 1000 mg via ORAL
  Filled 2018-05-26: qty 2

## 2018-05-26 MED ORDER — METHOCARBAMOL 500 MG PO TABS: 500.0000 mg | ORAL_TABLET | Freq: Two times a day (BID) | ORAL | 0 refills | Status: DC

## 2018-05-26 MED ORDER — KETOROLAC TROMETHAMINE 30 MG/ML IJ SOLN
30.0000 mg | Freq: Once | INTRAMUSCULAR | Status: AC
  Administered 2018-05-26: 30 mg via INTRAMUSCULAR
  Filled 2018-05-26: qty 1

## 2018-05-26 MED ORDER — METHOCARBAMOL 500 MG PO TABS
1000.0000 mg | ORAL_TABLET | Freq: Once | ORAL | Status: AC
  Administered 2018-05-26: 1000 mg via ORAL
  Filled 2018-05-26: qty 2

## 2018-05-26 MED FILL — NAPROXEN 500 MG TABLET: 500 | 15 days supply | Qty: 30 | Fill #0

## 2018-05-26 MED FILL — METHOCARBAMOL 500 MG TABLET: 500 | 10 days supply | Qty: 20 | Fill #0

## 2018-05-26 NOTE — ED Triage Notes (Signed)
C/o lower back pain x 4 days-denies injury-NAD-slow gait

## 2018-05-26 NOTE — ED Provider Notes (Signed)
[facility] EMERGENCY DEPARTMENT Provider Note   CSN: [id number] Arrival date & time: 05/26/18  1047     History   Chief Complaint Chief Complaint  Patient presents with  . Back Pain    HPI Donna Hayes is a 32 y.o. female.  Donna Hayes is a 32 y.o. Female with a history of sickle cell trait, who presents to the emergency department for evaluation of 4 days of left lower back pain.  She reports she was doing a lot of heavy lifting moving into a new apartment and thinks she may have injured a muscle.  She reports she has been treating the back pain with Tylenol and ibuprofen but has not been taking either of these regularly, these have given minimal improvement she is still continues to have pain, pain occasionally seems to radiate into the left buttock and leg but not consistently.  She denies associated numbness or weakness in her legs, no loss of bowel or bladder control, no saddle anesthesia.  No associated fevers, night sweats or weight loss.  No abdominal pain, nausea, vomiting, dysuria or urinary frequency.  No vaginal bleeding or vaginal discharge.     Past Medical History:  Diagnosis Date  . Heart murmur   . Obesity     Patient Active Problem List   Diagnosis Date Noted  . Varicose veins of lower extremities with other complications 05/13/2014  . Constipation 10/14/2013  . S/P C-section 10/04/2013  . Heartburn in pregnancy in third trimester 09/29/2013  . Sickle cell trait (HCC) 09/27/2013  . History of cervical LEEP biopsy affecting care of mother, antepartum 09/27/2013  . Supervision of low-risk pregnancy 09/09/2013  . Previous cesarean delivery, antepartum condition or complication 09/09/2013    Past Surgical History:  Procedure Laterality Date  . CERVICAL BIOPSY  W/ LOOP ELECTRODE EXCISION    . CESAREAN SECTION    . CESAREAN SECTION N/A 10/04/2013   Procedure: CESAREAN SECTION;  Surgeon: Reva Bores, MD;  Location: [facility];  Service:  Obstetrics;  Laterality: N/A;     OB History    Gravida  6   Para  2   Term  2   Preterm      AB  4   Living  2     SAB  2   TAB  2   Ectopic      Multiple      Live Births  2            Home Medications    Prior to Admission medications   Medication Sig Start Date End Date Taking? Authorizing Provider  benzonatate (TESSALON) 100 MG capsule Take 1 capsule (100 mg total) by mouth every 8 (eight) hours. 12/10/15   Joy, Shawn C, PA-C  HYDROcodone-acetaminophen (NORCO/VICODIN) 5-325 MG per tablet Take 1-2 tablets by mouth every 4 (four) hours as needed. 03/11/15   Teressa Lower, NP  ibuprofen (ADVIL,MOTRIN) 800 MG tablet Take 1 tablet (800 mg total) by mouth 3 (three) times daily. 12/10/15   Joy, Shawn C, PA-C  methocarbamol (ROBAXIN) 500 MG tablet Take 1 tablet (500 mg total) by mouth 2 (two) times daily. 05/26/18   Dartha Lodge, PA-C  naproxen (NAPROSYN) 500 MG tablet Take 1 tablet (500 mg total) by mouth 2 (two) times daily. 05/26/18   Dartha Lodge, PA-C  ondansetron (ZOFRAN ODT) 4 MG disintegrating tablet Take 1 tablet (4 mg total) by mouth every 8 (eight) hours as needed for nausea or vomiting. 03/11/15  Teressa Lower, NP  ondansetron (ZOFRAN ODT) 4 MG disintegrating tablet Take 1 tablet (4 mg total) by mouth every 8 (eight) hours as needed for nausea or vomiting. 12/10/15   Joy, Hillard Danker, PA-C    Family History Family History  Problem Relation Age of Onset  . Diabetes Mother   . Lupus Mother   . Heart disease Mother     Social History Social History   Tobacco Use  . Smoking status: Never Smoker  . Smokeless tobacco: Never Used  Substance Use Topics  . Alcohol use: Yes    Comment: occ  . Drug use: No     Allergies   Penicillins   Review of Systems Review of Systems  Constitutional: Negative for chills and fever.  HENT: Negative.   Respiratory: Negative for shortness of breath.   Cardiovascular: Negative for chest pain.    Gastrointestinal: Negative for abdominal pain, diarrhea, nausea and vomiting.  Genitourinary: Negative for dysuria, flank pain, frequency, hematuria, vaginal bleeding and vaginal discharge.  Musculoskeletal: Positive for back pain. Negative for arthralgias, joint swelling and myalgias.  Skin: Negative for color change and rash.  Neurological: Negative for dizziness, weakness, numbness and headaches.     Physical Exam Updated Vital Signs BP (!) 135/106 (BP Location: Left Arm)   Pulse 74   Temp 98.4 F (36.9 C) (Oral)   Resp 18   Ht 5\' 2"  (1.575 m)   Wt 119.7 kg   LMP 05/08/2018   SpO2 100%   BMI 48.29 kg/m   Physical Exam  Constitutional: She appears well-developed and well-nourished. No distress.  HENT:  Head: Normocephalic and atraumatic.  Mouth/Throat: Oropharynx is clear and moist.  Eyes: Right eye exhibits no discharge. Left eye exhibits no discharge.  Neck: Neck supple.  Cardiovascular: Normal rate, regular rhythm, normal heart sounds and intact distal pulses.  Pulmonary/Chest: Effort normal and breath sounds normal. No stridor. No respiratory distress. She has no wheezes. She has no rales.  Respirations equal and unlabored, patient able to speak in full sentences, lungs clear to auscultation bilaterally  Abdominal: Soft. Bowel sounds are normal. She exhibits no distension and no mass. There is no tenderness. There is no guarding.  Abdomen soft, nondistended, nontender to palpation in all quadrants without guarding or peritoneal signs, no CVA tenderness bilaterally  Musculoskeletal:  There is tenderness to palpation over the midline lumbar spine which extends across the left lower back musculature, no palpable deformity or overlying erythema or skin changes.  Pain is worse with movement in the lower extremities but negative straight leg raise on both sides  Neurological: She is alert. Coordination normal.  Moving all extremities without difficulty. Bilateral lower  extremities with 5/5 strength in proximal and distal muscle groups, and with dorsi and plantar flexion. Sensation intact throughout bilateral lower extremities. 2+ patellar DTRs. Ambulated with steady gait  Skin: She is not diaphoretic.  Psychiatric: She has a normal mood and affect. Her behavior is normal.  Nursing note and vitals reviewed.    ED Treatments / Results  Labs (all labs ordered are listed, but only abnormal results are displayed) Labs Reviewed  URINALYSIS, ROUTINE W REFLEX MICROSCOPIC  PREGNANCY, URINE    EKG None  Radiology Dg Lumbar Spine Complete  Result Date: 05/26/2018 CLINICAL DATA:  Low back pain.  No known injury. EXAM: LUMBAR SPINE - COMPLETE 4+ VIEW COMPARISON:  CT 03/11/2015. FINDINGS: No acute or focal bony abnormality identified. No evidence of fracture. IMPRESSION: No acute or focal abnormality. Electronically  Signed   By: Maisie Fus  Register   On: 05/26/2018 12:12    Procedures Procedures (including critical care time)  Medications Ordered in ED Medications  methocarbamol (ROBAXIN) tablet 1,000 mg (1,000 mg Oral Given 05/26/18 1209)  ketorolac (TORADOL) 30 MG/ML injection 30 mg (30 mg Intramuscular Given 05/26/18 1209)  acetaminophen (TYLENOL) tablet 1,000 mg (1,000 mg Oral Given 05/26/18 1209)     Initial Impression / Assessment and Plan / ED Course  I have reviewed the triage vital signs and the nursing notes.  Pertinent labs & imaging results that were available during my care of the patient were reviewed by me and considered in my medical decision making (see chart for details).  Patient with back pain.  No neurological deficits and normal neuro exam.  Patient can walk but states is painful.  No loss of bowel or bladder control.  No concern for cauda equina.  No fever, night sweats, weight loss, h/o cancer, IVDU.  X-rays negative, no evidence of UTI on urinalysis and negative pregnancy.  I suspect muscle strain.  Pain improved with treatment here in  the ED.  RICE protocol and pain medicine indicated and discussed with patient.   Final Clinical Impressions(s) / ED Diagnoses   Final diagnoses:  Acute left-sided low back pain without sciatica  Muscle spasm of back    ED Discharge Orders         Ordered    methocarbamol (ROBAXIN) 500 MG tablet  2 times daily     05/26/18 1233    naproxen (NAPROSYN) 500 MG tablet  2 times daily     05/26/18 1233           Dartha Lodge, [place] 05/26/18 1242    Alvira Monday, MD 05/27/18 1459

## 2018-05-26 NOTE — Discharge Instructions (Signed)
Your x-ray looks good today and there are no signs of urinary tract infection.  This is likely a muscle strain putting some pressure on the nerves in your low back.  Treat with Naprosyn twice daily, Tylenol as needed every 6 hours and Robaxin as needed for pain.  Robaxin can cause some drowsiness do not take before driving.  You can also use icy hot or over-the-counter lidocaine patches over the area that is hurting.  You can alternate ice and heat.  If symptoms are still not improving follow-up with your regular doctor or Dr. Norton Blizzard sports medicine.  Return to the emergency department for significantly worsened back pain, fevers, abdominal pain, loss of bowel or bladder control, weakness or numbness or any other new or concerning symptoms.

## 2019-09-14 ENCOUNTER — Emergency Department (HOSPITAL_BASED_OUTPATIENT_CLINIC_OR_DEPARTMENT_OTHER): Payer: Medicaid Other

## 2019-09-14 ENCOUNTER — Encounter (HOSPITAL_BASED_OUTPATIENT_CLINIC_OR_DEPARTMENT_OTHER): Payer: Self-pay

## 2019-09-14 ENCOUNTER — Emergency Department (HOSPITAL_BASED_OUTPATIENT_CLINIC_OR_DEPARTMENT_OTHER)
Admission: EM | Admit: 2019-09-14 | Discharge: 2019-09-14 | Disposition: A | Discharge status: Home / Self Care | Payer: Medicaid Other | Attending: Emergency Medicine | Admitting: Emergency Medicine

## 2019-09-14 ENCOUNTER — Other Ambulatory Visit: Payer: Self-pay

## 2019-09-14 DIAGNOSIS — R05 Cough: Secondary | ICD-10-CM | POA: Diagnosis present

## 2019-09-14 DIAGNOSIS — U071 COVID-19: Secondary | ICD-10-CM | POA: Diagnosis not present

## 2019-09-14 LAB — SARS CORONAVIRUS 2 AG (30 MIN TAT): SARS Coronavirus 2 Ag: POSITIVE — AB

## 2019-09-14 MED ORDER — ALBUTEROL SULFATE HFA 108 (90 BASE) MCG/ACT IN AERS
2.0000 | INHALATION_SPRAY | RESPIRATORY_TRACT | Status: DC | PRN
  Administered 2019-09-14: 2 via RESPIRATORY_TRACT
  Filled 2019-09-14: qty 6.7

## 2019-09-14 MED ORDER — DEXAMETHASONE 6 MG PO TABS: 6.0000 mg | ORAL_TABLET | Freq: Every day | ORAL | 0 refills | Status: DC

## 2019-09-14 NOTE — ED Provider Notes (Signed)
[facility] EMERGENCY DEPARTMENT Provider Note   CSN: [id number] Arrival date & time: 09/14/19  1430     History Chief Complaint  Patient presents with  . Cough    Donna Hayes is a 33 y.o. female.  HPI Patient presents with cough some chills shortness of breath.  Began around 3 days ago.  States daughter and 2 other family members have similar symptoms.  States that her daughter's teacher was exposed to someone with Covid.  States today she lost her sense of taste and smell.  History of asthma but states she usually does not have to use an inhaler.  Denies pregnancy.  Had some crampy abdominal pain yesterday but states that is resolved.  No diarrhea or constipation.    Past Medical History:  Diagnosis Date  . Heart murmur   . Obesity     Patient Active Problem List   Diagnosis Date Noted  . Varicose veins of lower extremities with other complications 05/13/2014  . Constipation 10/14/2013  . S/P C-section 10/04/2013  . Heartburn in pregnancy in third trimester 09/29/2013  . Sickle cell trait (HCC) 09/27/2013  . History of cervical LEEP biopsy affecting care of mother, antepartum 09/27/2013  . Supervision of low-risk pregnancy 09/09/2013  . Previous cesarean delivery, antepartum condition or complication 09/09/2013    Past Surgical History:  Procedure Laterality Date  . CERVICAL BIOPSY  W/ LOOP ELECTRODE EXCISION    . CESAREAN SECTION    . CESAREAN SECTION N/A 10/04/2013   Procedure: CESAREAN SECTION;  Surgeon: Reva Bores, MD;  Location: [facility];  Service: Obstetrics;  Laterality: N/A;     OB History    Gravida  6   Para  2   Term  2   Preterm      AB  4   Living  2     SAB  2   TAB  2   Ectopic      Multiple      Live Births  2           Family History  Problem Relation Age of Onset  . Diabetes Mother   . Lupus Mother   . Heart disease Mother     Social History   Tobacco Use  . Smoking status: Never Smoker  .  Smokeless tobacco: Never Used  Substance Use Topics  . Alcohol use: Yes    Comment: occ  . Drug use: No    Home Medications Prior to Admission medications   Medication Sig Start Date End Date Taking? Authorizing Provider  benzonatate (TESSALON) 100 MG capsule Take 1 capsule (100 mg total) by mouth every 8 (eight) hours. 12/10/15   Joy, Shawn C, PA-C  dexamethasone (DECADRON) 6 MG tablet Take 1 tablet (6 mg total) by mouth daily. 09/14/19   Benjiman Core, MD  HYDROcodone-acetaminophen (NORCO/VICODIN) 5-325 MG per tablet Take 1-2 tablets by mouth every 4 (four) hours as needed. 03/11/15   Teressa Lower, NP  ibuprofen (ADVIL,MOTRIN) 800 MG tablet Take 1 tablet (800 mg total) by mouth 3 (three) times daily. 12/10/15   Joy, Shawn C, PA-C  methocarbamol (ROBAXIN) 500 MG tablet Take 1 tablet (500 mg total) by mouth 2 (two) times daily. 05/26/18   Dartha Lodge, PA-C  naproxen (NAPROSYN) 500 MG tablet Take 1 tablet (500 mg total) by mouth 2 (two) times daily. 05/26/18   Dartha Lodge, PA-C  ondansetron (ZOFRAN ODT) 4 MG disintegrating tablet Take 1 tablet (4 mg total)  by mouth every 8 (eight) hours as needed for nausea or vomiting. 03/11/15   Teressa LowerPickering, Vrinda, NP  ondansetron (ZOFRAN ODT) 4 MG disintegrating tablet Take 1 tablet (4 mg total) by mouth every 8 (eight) hours as needed for nausea or vomiting. 12/10/15   Joy, Shawn C, PA-C    Allergies    Penicillins  Review of Systems   Review of Systems  Constitutional: Positive for chills, fatigue and fever.  HENT: Negative for congestion.   Respiratory: Positive for cough and shortness of breath.   Cardiovascular: Negative for chest pain.  Gastrointestinal: Positive for abdominal pain.  Genitourinary: Negative for flank pain.  Musculoskeletal: Negative for back pain.  Skin: Negative for rash.  Neurological: Negative for weakness.  Psychiatric/Behavioral: Negative for confusion.    Physical Exam Updated Vital Signs BP (!) 144/105 (BP  Location: Left Arm)   Pulse 97   Temp 99.5 F (37.5 C) (Oral)   Resp 16   Ht 5' 2.5" (1.588 m)   Wt 127 kg   LMP 08/31/2019   SpO2 100%   BMI 50.40 kg/m   Physical Exam Vitals and nursing note reviewed.  HENT:     Head: Atraumatic.  Eyes:     Pupils: Pupils are equal, round, and reactive to light.  Cardiovascular:     Rate and Rhythm: Regular rhythm.  Pulmonary:     Comments: Mildly harsh breath sounds with mild wheezes. Abdominal:     Tenderness: There is no abdominal tenderness.  Musculoskeletal:     Right lower leg: No edema.     Left lower leg: No edema.  Skin:    General: Skin is warm.     Capillary Refill: Capillary refill takes less than 2 seconds.  Neurological:     Mental Status: She is alert and oriented to person, place, and time.     ED Results / Procedures / Treatments   Labs (all labs ordered are listed, but only abnormal results are displayed) Labs Reviewed  SARS CORONAVIRUS 2 AG (30 MIN TAT) - Abnormal; Notable for the following components:      Result Value   SARS Coronavirus 2 Ag POSITIVE (*)    All other components within normal limits    EKG None  Radiology DG Chest Portable 1 View  Result Date: 09/14/2019 CLINICAL DATA:  Cough with loss of taste and smell EXAM: PORTABLE CHEST 1 VIEW COMPARISON:  December 10, 2015 FINDINGS: There is a focal area of airspace opacity in the medial right base. Lungs elsewhere clear. Heart size and pulmonary vascularity are normal. No adenopathy. No bone lesions. IMPRESSION: Focal opacity medial right base, likely early pneumonia. Lungs elsewhere clear. No adenopathy evident. Electronically Signed   By: Bretta BangWilliam  Woodruff III M.D.   On: 09/14/2019 15:45    Procedures Procedures (including critical care time)  Medications Ordered in ED Medications  albuterol (VENTOLIN HFA) 108 (90 Base) MCG/ACT inhaler 2 puff (has no administration in time range)    ED Course  I have reviewed the triage vital signs and the  nursing notes.  Pertinent labs & imaging results that were available during my care of the patient were reviewed by me and considered in my medical decision making (see chart for details).    MDM Rules/Calculators/A&P                     Patient with cough with loss of taste.  Family numbers have similar symptoms.  X-ray showed potential pneumonia but  Covid test is also positive.  I think this more likely Covid pneumonia.  Well-appearing.  Will treat with Decadron since patient has had some wheezing with a history of asthma.  Discharge home.  Follow-up instructions an what to watch for discussed with patient.  Will follow up with PCP as needed. Final Clinical Impression(s) / ED Diagnoses Final diagnoses:  [id number]    Rx / DC Orders ED Discharge Orders         Ordered    dexamethasone (DECADRON) 6 MG tablet  Daily     09/14/19 1622           Davonna Belling, MD 09/14/19 1625

## 2019-09-14 NOTE — Discharge Instructions (Addendum)
Follow-up with your doctor as needed.  Return for worsening shortness of breath ?

## 2019-09-14 NOTE — ED Triage Notes (Addendum)
Pt c/o flu like sx x 3 days-loss of taste/smell x today-NAD-steady gait

## 2021-06-03 IMAGING — DX DG CHEST 1V PORT
1 series · 1 of 1 positions shown · non-contrast
Comparison: December 10, 2015

CLINICAL DATA: Cough with loss of taste and smell

EXAM:
PORTABLE CHEST 1 VIEW

[chest ap]
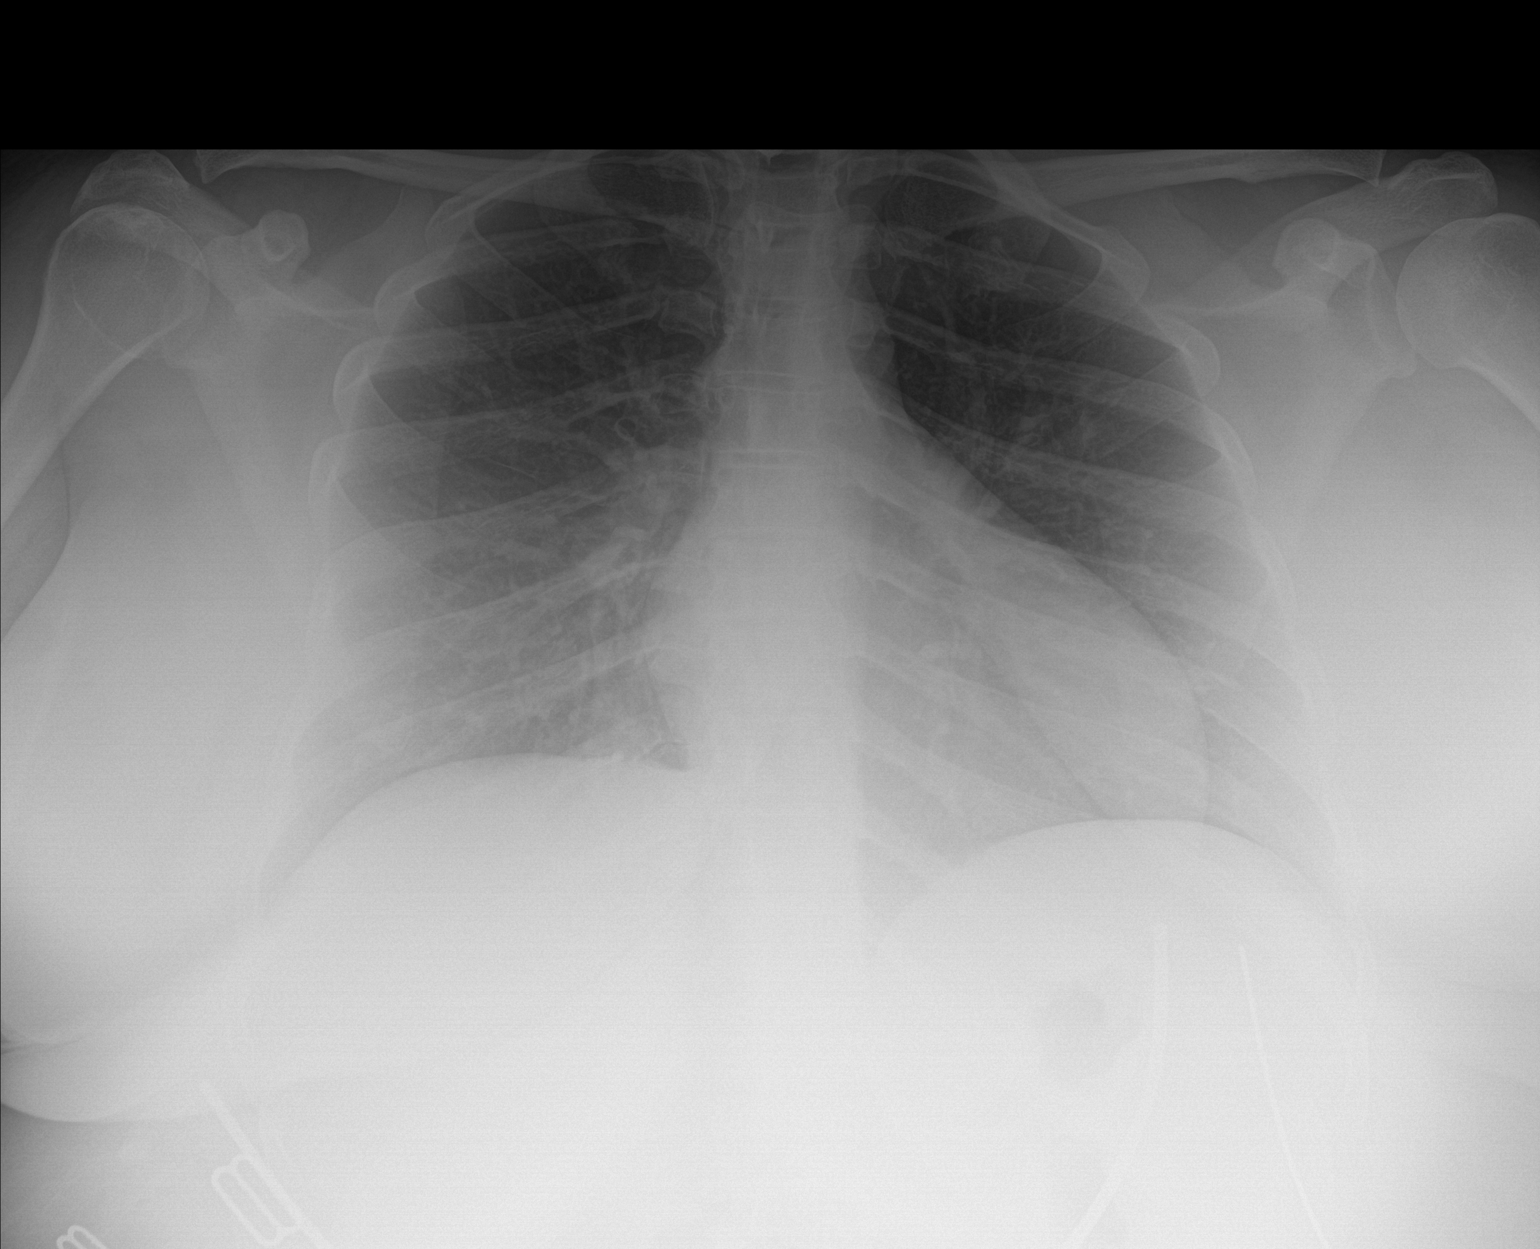

[1 of 1 positions shown; findings below may reference images not displayed]

FINDINGS: There is a focal area of airspace opacity in the medial right base.
Lungs elsewhere clear. Heart size and pulmonary vascularity are
normal. No adenopathy. No bone lesions.
IMPRESSION: Focal opacity medial right base, likely early pneumonia. Lungs
elsewhere clear. No adenopathy evident.

## 2022-11-23 ENCOUNTER — Encounter (HOSPITAL_BASED_OUTPATIENT_CLINIC_OR_DEPARTMENT_OTHER): Payer: Self-pay

## 2022-11-23 ENCOUNTER — Other Ambulatory Visit: Payer: Self-pay

## 2022-11-23 ENCOUNTER — Emergency Department (HOSPITAL_BASED_OUTPATIENT_CLINIC_OR_DEPARTMENT_OTHER): Payer: Medicaid Other

## 2022-11-23 ENCOUNTER — Emergency Department (HOSPITAL_BASED_OUTPATIENT_CLINIC_OR_DEPARTMENT_OTHER)
Admission: EM | Admit: 2022-11-23 | Discharge: 2022-11-23 | Disposition: A | Discharge status: Home / Self Care | Payer: Medicaid Other | Attending: Emergency Medicine | Admitting: Emergency Medicine

## 2022-11-23 DIAGNOSIS — R1033 Periumbilical pain: Secondary | ICD-10-CM | POA: Diagnosis present

## 2022-11-23 LAB — WET PREP, GENITAL
Sperm: NONE SEEN
Trich, Wet Prep: NONE SEEN
WBC, Wet Prep HPF POC: <10 (ref <10)
Yeast Wet Prep HPF POC: NONE SEEN

## 2022-11-23 LAB — COMPREHENSIVE METABOLIC PANEL
ALT: 16 U/L (ref 0–44)
AST: 18 U/L (ref 15–41)
Albumin: 3.5 g/dL (ref 3.5–5.0)
Alkaline Phosphatase: 56 U/L (ref 38–126)
Anion gap: 6 (ref 5–15)
BUN: 13 mg/dL (ref 6–20)
CO2: 25 mmol/L (ref 22–32)
Calcium: 8.3 mg/dL — ABNORMAL LOW (ref 8.9–10.3)
Chloride: 104 mmol/L (ref 98–111)
Creatinine, Ser: 0.88 mg/dL (ref 0.44–1.00)
GFR, Estimated: >60 mL/min (ref >60)
Glucose, Bld: 106 mg/dL — ABNORMAL HIGH (ref 70–99)
Potassium: 3.9 mmol/L (ref 3.5–5.1)
Sodium: 135 mmol/L (ref 135–145)
Total Bilirubin: 0.5 mg/dL (ref 0.3–1.2)
Total Protein: 7.8 g/dL (ref 6.5–8.1)

## 2022-11-23 LAB — CBC
HCT: 33.4 % — ABNORMAL LOW (ref 36.0–46.0)
Hemoglobin: 11.3 g/dL — ABNORMAL LOW (ref 12.0–15.0)
MCH: 26 pg (ref 26.0–34.0)
MCHC: 33.8 g/dL (ref 30.0–36.0)
MCV: 77 fL — ABNORMAL LOW (ref 80.0–100.0)
Platelets: 309 10*3/uL (ref 150–400)
RBC: 4.34 MIL/uL (ref 3.87–5.11)
RDW: 15.9 % — ABNORMAL HIGH (ref 11.5–15.5)
WBC: 11.2 10*3/uL — ABNORMAL HIGH (ref 4.0–10.5)
nRBC: 0 % (ref 0.0–0.2)

## 2022-11-23 LAB — URINALYSIS, ROUTINE W REFLEX MICROSCOPIC
Bilirubin Urine: NEGATIVE
Glucose, UA: NEGATIVE mg/dL
Hgb urine dipstick: NEGATIVE
Ketones, ur: NEGATIVE mg/dL
Leukocytes,Ua: NEGATIVE
Nitrite: NEGATIVE
Protein, ur: NEGATIVE mg/dL
Specific Gravity, Urine: 1.02 (ref 1.005–1.030)
pH: 6 (ref 5.0–8.0)

## 2022-11-23 LAB — PREGNANCY, URINE: Preg Test, Ur: NEGATIVE

## 2022-11-23 LAB — LIPASE, BLOOD: Lipase: 23 U/L (ref 11–51)

## 2022-11-23 MED ORDER — IBUPROFEN 600 MG PO TABS: 600.0000 mg | ORAL_TABLET | Freq: Four times a day (QID) | ORAL | 0 refills | Status: DC | PRN

## 2022-11-23 MED ORDER — OXYCODONE-ACETAMINOPHEN 5-325 MG PO TABS
1.0000 | ORAL_TABLET | Freq: Once | ORAL | Status: AC
  Administered 2022-11-23: 1 via ORAL
  Filled 2022-11-23: qty 1

## 2022-11-23 NOTE — ED Triage Notes (Signed)
C/o abdominal pain intermittently x 1 month. Worsened over the past couple days. States pain is in pelvic region. Also c/o nausea.

## 2022-11-23 NOTE — ED Provider Notes (Signed)
[place] [facility] [place] Provider Note   CSN: [id number] Arrival date & time: 11/23/22  1400     History  Chief Complaint  Patient presents with   Abdominal Pain    Donna Hayes is a 37 y.o. female.  HPI     37 year old female comes in with chief complaint of abdominal pain.  Patient indicates that she has been having intermittent abdominal pain for the last month.  Pain is located in the periumbilical region and radiates down in to the pelvic region.  Patient states that this morning, the pain was however more severe and has been constant since 11 AM.  Patient has associated nausea.  Review of system is negative for any UTI-like symptoms, vaginal discharge or bleeding.  Patient has previous history of ovarian cyst and she is supposed to get routine ultrasound again in April.  This ultrasound was ordered because of her intermittent abdominal pain.  Patient has surgical history of C-section.  She denies any postprandial component to this pain.  Home Medications Prior to Admission medications   Medication Sig Start Date End Date Taking? Authorizing Provider  ibuprofen (ADVIL) 600 MG tablet Take 1 tablet (600 mg total) by mouth every 6 (six) hours as needed. 11/23/22  Yes Varney Biles, MD  benzonatate (TESSALON) 100 MG capsule Take 1 capsule (100 mg total) by mouth every 8 (eight) hours. 12/10/15   Joy, Shawn C, PA-C  dexamethasone (DECADRON) 6 MG tablet Take 1 tablet (6 mg total) by mouth daily. 09/14/19   Davonna Belling, MD  methocarbamol (ROBAXIN) 500 MG tablet Take 1 tablet (500 mg total) by mouth 2 (two) times daily. 05/26/18   Jacqlyn Larsen, PA-C  ondansetron (ZOFRAN ODT) 4 MG disintegrating tablet Take 1 tablet (4 mg total) by mouth every 8 (eight) hours as needed for nausea or vomiting. 03/11/15   Glendell Docker, NP  ondansetron (ZOFRAN ODT) 4 MG disintegrating tablet Take 1 tablet (4 mg total) by mouth every 8 (eight) hours as needed for  nausea or vomiting. 12/10/15   Joy, Shawn C, PA-C      Allergies    Penicillins    Review of Systems   Review of Systems  All other systems reviewed and are negative.   Physical Exam Updated Vital Signs BP 134/78   Pulse 66   Temp 98.7 F (37.1 C) (Oral)   Resp 16   Ht 5' 2.5" (1.588 m)   Wt 122.5 kg   LMP 10/31/2022 (Approximate)   SpO2 98%   BMI 48.60 kg/m  Physical Exam Vitals and nursing note reviewed.  Constitutional:      Appearance: She is well-developed.  HENT:     Head: Atraumatic.  Cardiovascular:     Rate and Rhythm: Normal rate.  Pulmonary:     Effort: Pulmonary effort is normal.  Abdominal:     Tenderness: There is abdominal tenderness in the periumbilical area. There is no guarding or rebound. Negative signs include Murphy's sign and McBurney's sign.  Genitourinary:    Vagina: Vaginal discharge present.     Cervix: No cervical motion tenderness.     Uterus: Normal.      Adnexa: Right adnexa normal and left adnexa normal.  Musculoskeletal:     Cervical back: Normal range of motion and neck supple.  Skin:    General: Skin is warm and dry.  Neurological:     Mental Status: She is alert and oriented to person, place, and time.  ED Results / Procedures / Treatments   Labs (all labs ordered are listed, but only abnormal results are displayed) Labs Reviewed  WET PREP, GENITAL - Abnormal; Notable for the following components:      Result Value   Clue Cells Wet Prep HPF POC PRESENT (*)    All other components within normal limits  COMPREHENSIVE METABOLIC PANEL - Abnormal; Notable for the following components:   Glucose, Bld 106 (*)    Calcium 8.3 (*)    All other components within normal limits  CBC - Abnormal; Notable for the following components:   WBC 11.2 (*)    Hemoglobin 11.3 (*)    HCT 33.4 (*)    MCV 77.0 (*)    RDW 15.9 (*)    All other components within normal limits  LIPASE, BLOOD  URINALYSIS, ROUTINE W REFLEX MICROSCOPIC   PREGNANCY, URINE  GC/CHLAMYDIA PROBE AMP ([place]) NOT AT [facility]    EKG None  Radiology US PELVIC COMPLETE W TRANSVAGINAL AND TORSION R/O  Result Date: 11/23/2022 CLINICAL DATA:  Pelvic pain.  History of C-section x2. EXAM: TRANSABDOMINAL AND TRANSVAGINAL ULTRASOUND OF PELVIS DOPPLER ULTRASOUND OF OVARIES TECHNIQUE: Both transabdominal and transvaginal ultrasound examinations of the pelvis were performed. Transabdominal technique was performed for global imaging of the pelvis including uterus, ovaries, adnexal regions, and pelvic cul-de-sac. It was necessary to proceed with endovaginal exam following the transabdominal exam to visualize the uterus, endometrium and ovaries. Color and duplex Doppler ultrasound was utilized to evaluate blood flow to the ovaries. COMPARISON:  None Available. FINDINGS: Uterus Measurements: 8.4 x 3.5 x 4.3 cm (volume = 66 cm^3). No fibroids or other mass visualized. Endometrium Thickness: 3.6 mm.  No focal abnormality visualized. Right ovary Measurements: 2.7 x 2.2 x 2.0 cm = volume: 6.1 mL. Within the right ovary there is a 1.5 cm, unilocular thick walled cyst with prominent peripheral blood flow. Low level internal echoes are noted throughout. Left ovary Measurements: 2.4 x 1.6 x 2.7 cm = volume: 5.5 mL. Normal appearance/no adnexal mass. Pulsed Doppler evaluation of both ovaries demonstrates normal low-resistance arterial and venous waveforms. Other findings No abnormal free fluid. IMPRESSION: 1. No evidence for ovarian torsion. 2. Right ovarian corpus luteal cyst. Electronically Signed   By: Kerby Moors M.D.   On: 11/23/2022 16:52    Procedures Procedures    Medications Ordered in ED Medications  oxyCODONE-acetaminophen (PERCOCET/ROXICET) 5-325 MG per tablet 1 tablet (1 tablet Oral Given 11/23/22 1557)    ED Course/ Medical Decision Making/ A&P                             Medical Decision Making Amount and/or Complexity of Data Reviewed Labs:  ordered. Radiology: ordered.  Risk Prescription drug management.   37 year old female comes in with chief complaint of lower quadrant abdominal pain. Patient has been having intermittent pain, however today the pain is more severe than usual.  Patient has history of ovarian cyst and C-sections.  Differential diagnosis considered for this patient includes cholelithiasis, cholecystitis, ovarian torsion, fibroid, ovarian cyst, PID, cystitis, intra-abdominal infection/abscess.  Initial plan is to give patient something for pain and start with ultrasound pelvis.  Torsion study ordered given the associated nausea and the excruciating nature of the pain.  6:14 PM Patient's lab workup is reassuring.  Ultrasound pelvis is reassuring.  Pelvic exam was also overall reassuring.  Hemodynamics have stayed stable, with vital signs within normal limits.  Repeat  abdominal exam is unchanged.  After the oral pain medicine, patient feels better.  My suspicion for acute abdomen is low.  Patient's pain has been intermittently present now for several days, her lab workup is reassuring and her abdominal exam is not showing any peritonitis.  I discussed with the patient that the benefit does not outweigh the risk of CT scan at this time, and that I would prefer to wait and watch approach.  Patient is accepting of that recommendation.  She will return to the emergency room if her symptoms are getting worse, in which case we will have to consider CT scan.  Otherwise she will follow-up with her PCP in 1 week.  The patient appears reasonably screened and/or stabilized for discharge and I doubt any other medical condition or other [facility] requiring further screening, evaluation, or treatment in the ED at this time prior to discharge.   Results from the ER workup discussed with the patient face to face and all questions answered to the best of my ability. The patient is safe for discharge with strict return  precautions.   Final Clinical Impression(s) / ED Diagnoses Final diagnoses:  Periumbilical abdominal pain    Rx / DC Orders ED Discharge Orders          Ordered    ibuprofen (ADVIL) 600 MG tablet  Every 6 hours PRN        11/23/22 1812              Varney Biles, MD 11/23/22 1815

## 2022-11-23 NOTE — ED Notes (Signed)
Pt transported to US

## 2022-11-23 NOTE — Discharge Instructions (Addendum)
We saw in the emergency room for abdominal pain.  Our workup in the emergency room is reassuring.  You have a very small right-sided ovarian cyst, which we do not think is likely the cause for your pain.  Your lab workup including urine test, liver function test, renal function is reassuring as well.  Our recommendation is that you take the ibuprofen that is prescribed.  Simplify your diet for the next few days and follow-up with your doctor in 1 week.  Return to the emergency room if you start having worsening pain, severe nausea and vomiting, fevers, chills or bloody stools.

## 2022-11-25 LAB — GC/CHLAMYDIA PROBE AMP (~~LOC~~) NOT AT ARMC
Chlamydia: NEGATIVE
Comment: NEGATIVE
Comment: NORMAL
Neisseria Gonorrhea: NEGATIVE

## 2024-05-24 ENCOUNTER — Other Ambulatory Visit: Payer: Self-pay

## 2024-05-24 ENCOUNTER — Emergency Department (HOSPITAL_BASED_OUTPATIENT_CLINIC_OR_DEPARTMENT_OTHER)

## 2024-05-24 ENCOUNTER — Encounter (HOSPITAL_BASED_OUTPATIENT_CLINIC_OR_DEPARTMENT_OTHER): Payer: Self-pay | Admitting: Emergency Medicine

## 2024-05-24 ENCOUNTER — Inpatient Hospital Stay (HOSPITAL_BASED_OUTPATIENT_CLINIC_OR_DEPARTMENT_OTHER)
Admission: EM | Admit: 2024-05-24 | Discharge: 2024-05-26 | DRG: 690 | Disposition: A | Discharge status: Home / Self Care | Attending: Internal Medicine | Admitting: Internal Medicine

## 2024-05-24 DIAGNOSIS — N12 Tubulo-interstitial nephritis, not specified as acute or chronic: Secondary | ICD-10-CM | POA: Diagnosis present

## 2024-05-24 DIAGNOSIS — Z7952 Long term (current) use of systemic steroids: Secondary | ICD-10-CM

## 2024-05-24 DIAGNOSIS — A419 Sepsis, unspecified organism: Secondary | ICD-10-CM | POA: Diagnosis not present

## 2024-05-24 DIAGNOSIS — Z833 Family history of diabetes mellitus: Secondary | ICD-10-CM | POA: Diagnosis not present

## 2024-05-24 DIAGNOSIS — D649 Anemia, unspecified: Secondary | ICD-10-CM | POA: Diagnosis not present

## 2024-05-24 DIAGNOSIS — Z6841 Body Mass Index (BMI) 40.0 and over, adult: Secondary | ICD-10-CM

## 2024-05-24 DIAGNOSIS — Z880 Allergy status to penicillin: Secondary | ICD-10-CM

## 2024-05-24 DIAGNOSIS — Z8269 Family history of other diseases of the musculoskeletal system and connective tissue: Secondary | ICD-10-CM | POA: Diagnosis not present

## 2024-05-24 DIAGNOSIS — E669 Obesity, unspecified: Secondary | ICD-10-CM | POA: Diagnosis not present

## 2024-05-24 DIAGNOSIS — Z1152 Encounter for screening for COVID-19: Secondary | ICD-10-CM | POA: Diagnosis not present

## 2024-05-24 DIAGNOSIS — Z79899 Other long term (current) drug therapy: Secondary | ICD-10-CM | POA: Diagnosis not present

## 2024-05-24 DIAGNOSIS — E785 Hyperlipidemia, unspecified: Secondary | ICD-10-CM | POA: Diagnosis present

## 2024-05-24 DIAGNOSIS — Z8249 Family history of ischemic heart disease and other diseases of the circulatory system: Secondary | ICD-10-CM

## 2024-05-24 DIAGNOSIS — D509 Iron deficiency anemia, unspecified: Secondary | ICD-10-CM | POA: Diagnosis present

## 2024-05-24 DIAGNOSIS — R197 Diarrhea, unspecified: Secondary | ICD-10-CM | POA: Diagnosis present

## 2024-05-24 DIAGNOSIS — N10 Acute tubulo-interstitial nephritis: Secondary | ICD-10-CM | POA: Diagnosis present

## 2024-05-24 DIAGNOSIS — E66813 Obesity, class 3: Secondary | ICD-10-CM | POA: Diagnosis present

## 2024-05-24 LAB — RESP PANEL BY RT-PCR (RSV, FLU A&B, COVID)  RVPGX2
Influenza A by PCR: NEGATIVE
Influenza B by PCR: NEGATIVE
Resp Syncytial Virus by PCR: NEGATIVE
SARS Coronavirus 2 by RT PCR: NEGATIVE

## 2024-05-24 LAB — URINALYSIS, ROUTINE W REFLEX MICROSCOPIC
Bilirubin Urine: NEGATIVE
Glucose, UA: NEGATIVE mg/dL
Ketones, ur: NEGATIVE mg/dL
Nitrite: NEGATIVE
Protein, ur: 30 mg/dL — AB
Specific Gravity, Urine: 1.01 (ref 1.005–1.030)
pH: 6 (ref 5.0–8.0)

## 2024-05-24 LAB — CBC WITH DIFFERENTIAL/PLATELET
Abs Immature Granulocytes: 0.03 K/uL (ref 0.00–0.07)
Basophils Absolute: 0 K/uL (ref 0.0–0.1)
Basophils Relative: 0 %
Eosinophils Absolute: 0 K/uL (ref 0.0–0.5)
Eosinophils Relative: 0 %
HCT: 35.1 % — ABNORMAL LOW (ref 36.0–46.0)
Hemoglobin: 11.9 g/dL — ABNORMAL LOW (ref 12.0–15.0)
Immature Granulocytes: 0 %
Lymphocytes Relative: 18 %
Lymphs Abs: 1.4 K/uL (ref 0.7–4.0)
MCH: 25.8 pg — ABNORMAL LOW (ref 26.0–34.0)
MCHC: 33.9 g/dL (ref 30.0–36.0)
MCV: 76.1 fL — ABNORMAL LOW (ref 80.0–100.0)
Monocytes Absolute: 0.8 K/uL (ref 0.1–1.0)
Monocytes Relative: 11 %
Neutro Abs: 5.5 K/uL (ref 1.7–7.7)
Neutrophils Relative %: 71 %
Platelets: 265 K/uL (ref 150–400)
RBC: 4.61 MIL/uL (ref 3.87–5.11)
RDW: 16.2 % — ABNORMAL HIGH (ref 11.5–15.5)
WBC: 7.7 K/uL (ref 4.0–10.5)
nRBC: 0 % (ref 0.0–0.2)

## 2024-05-24 LAB — COMPREHENSIVE METABOLIC PANEL WITH GFR
ALT: 28 U/L (ref 0–44)
AST: 33 U/L (ref 15–41)
Albumin: 3.9 g/dL (ref 3.5–5.0)
Alkaline Phosphatase: 81 U/L (ref 38–126)
Anion gap: 11 (ref 5–15)
BUN: 10 mg/dL (ref 6–20)
CO2: 24 mmol/L (ref 22–32)
Calcium: 8.7 mg/dL — ABNORMAL LOW (ref 8.9–10.3)
Chloride: 98 mmol/L (ref 98–111)
Creatinine, Ser: 1 mg/dL (ref 0.44–1.00)
GFR, Estimated: >60 mL/min (ref >60)
Glucose, Bld: 136 mg/dL — ABNORMAL HIGH (ref 70–99)
Potassium: 4.5 mmol/L (ref 3.5–5.1)
Sodium: 133 mmol/L — ABNORMAL LOW (ref 135–145)
Total Bilirubin: 0.5 mg/dL (ref 0.0–1.2)
Total Protein: 8.4 g/dL — ABNORMAL HIGH (ref 6.5–8.1)

## 2024-05-24 LAB — LIPASE, BLOOD: Lipase: 19 U/L (ref 11–51)

## 2024-05-24 LAB — PREGNANCY, URINE: Preg Test, Ur: NEGATIVE

## 2024-05-24 LAB — URINALYSIS, MICROSCOPIC (REFLEX): WBC, UA: >50 WBC/hpf (ref 0–5)

## 2024-05-24 LAB — LACTIC ACID, PLASMA: Lactic Acid, Venous: 0.8 mmol/L (ref 0.5–1.9)

## 2024-05-24 MED ORDER — ACETAMINOPHEN 325 MG PO TABS
650.0000 mg | ORAL_TABLET | Freq: Four times a day (QID) | ORAL | Status: DC | PRN
  Administered 2024-05-25 (×2): 650 mg via ORAL
  Filled 2024-05-24 (×2): qty 2

## 2024-05-24 MED ORDER — ONDANSETRON HCL 4 MG PO TABS: 4.0000 mg | ORAL_TABLET | Freq: Four times a day (QID) | ORAL | Status: DC | PRN

## 2024-05-24 MED ORDER — SODIUM CHLORIDE 0.9 % IV SOLN
2.0000 g | Freq: Once | INTRAVENOUS | Status: AC
  Administered 2024-05-24: 2 g via INTRAVENOUS
  Filled 2024-05-24: qty 20

## 2024-05-24 MED ORDER — ACETAMINOPHEN 500 MG PO TABS
1000.0000 mg | ORAL_TABLET | Freq: Once | ORAL | Status: AC | PRN
  Administered 2024-05-24: 1000 mg via ORAL

## 2024-05-24 MED ORDER — ACETAMINOPHEN 650 MG RE SUPP: 650.0000 mg | Freq: Four times a day (QID) | RECTAL | Status: DC | PRN

## 2024-05-24 MED ORDER — ONDANSETRON HCL 4 MG/2ML IJ SOLN
4.0000 mg | Freq: Once | INTRAMUSCULAR | Status: AC
  Administered 2024-05-24: 4 mg via INTRAVENOUS
  Filled 2024-05-24: qty 2

## 2024-05-24 MED ORDER — HYDROCODONE-ACETAMINOPHEN 5-325 MG PO TABS: 1.0000 | ORAL_TABLET | ORAL | Status: DC | PRN

## 2024-05-24 MED ORDER — ONDANSETRON HCL 4 MG/2ML IJ SOLN
4.0000 mg | Freq: Four times a day (QID) | INTRAMUSCULAR | Status: DC | PRN
  Administered 2024-05-24: 4 mg via INTRAVENOUS
  Filled 2024-05-24: qty 2

## 2024-05-24 MED ORDER — IOHEXOL 300 MG/ML  SOLN
100.0000 mL | Freq: Once | INTRAMUSCULAR | Status: AC | PRN
  Administered 2024-05-24: 100 mL via INTRAVENOUS

## 2024-05-24 MED ORDER — SODIUM CHLORIDE 0.9 % IV SOLN: INTRAVENOUS | Status: AC

## 2024-05-24 MED ORDER — SODIUM CHLORIDE 0.9 % IV BOLUS
1000.0000 mL | Freq: Once | INTRAVENOUS | Status: AC
  Administered 2024-05-24: 1000 mL via INTRAVENOUS

## 2024-05-24 MED ORDER — KETOROLAC TROMETHAMINE 15 MG/ML IJ SOLN
15.0000 mg | Freq: Once | INTRAMUSCULAR | Status: AC
  Administered 2024-05-24: 15 mg via INTRAVENOUS
  Filled 2024-05-24: qty 1

## 2024-05-24 MED ORDER — FENTANYL CITRATE PF 50 MCG/ML IJ SOSY: 12.5000 ug | PREFILLED_SYRINGE | INTRAMUSCULAR | Status: DC | PRN

## 2024-05-24 MED ORDER — SODIUM CHLORIDE 0.9 % IV SOLN
2.0000 g | INTRAVENOUS | Status: DC
  Administered 2024-05-25: 2 g via INTRAVENOUS
  Filled 2024-05-24: qty 20

## 2024-05-24 NOTE — ED Provider Notes (Signed)
 [place]  [facility] Provider Note  CSN: [id number] Arrival date & time: 05/24/24 1150  Chief Complaint(s) Abdominal Pain  HPI Donna Hayes is a 38 y.o. female with past medical history as below, significant for obesity, C-section who presents to the ED with complaint of abdominal pain nausea vomiting  She has been feeling well for the past 4 days, having some suprapubic abdominal discomfort, right-sided flank pain, nausea, couple episodes of emesis.  Subjective fevers and chills, dysuria/urgency.  No hematuria.  Occasional diarrhea, no melena or BRBPR.  No recent travel, does report someone at work was sick and coughing on her.  Family at bedside   Past Medical History Past Medical History:  Diagnosis Date   Heart murmur    Obesity    Patient Active Problem List   Diagnosis Date Noted   Varicose veins of bilateral lower extremities with other complications 05/13/2014   Constipation 10/14/2013   S/P C-section 10/04/2013   Heartburn in pregnancy in third trimester 09/29/2013   Sickle cell trait (HCC) 09/27/2013   History of cervical LEEP biopsy affecting care of mother, antepartum 09/27/2013   Supervision of low-risk pregnancy 09/09/2013   Previous cesarean delivery, antepartum condition or complication 09/09/2013   Home Medication(s) Prior to Admission medications   Medication Sig Start Date End Date Taking? Authorizing Provider  benzonatate  (TESSALON ) 100 MG capsule Take 1 capsule (100 mg total) by mouth every 8 (eight) hours. 12/10/15   Joy, Shawn C, PA-C  dexamethasone  (DECADRON ) 6 MG tablet Take 1 tablet (6 mg total) by mouth daily. 09/14/19   Patsey Lot, MD  ibuprofen  (ADVIL ) 600 MG tablet Take 1 tablet (600 mg total) by mouth every 6 (six) hours as needed. 11/23/22   Charlyn Sora, MD  methocarbamol  (ROBAXIN ) 500 MG tablet Take 1 tablet (500 mg total) by mouth 2 (two) times daily. 05/26/18   Kehrli, Kelsey F, PA-C  ondansetron   (ZOFRAN  ODT) 4 MG disintegrating tablet Take 1 tablet (4 mg total) by mouth every 8 (eight) hours as needed for nausea or vomiting. 03/11/15   Patsey Satchel, NP  ondansetron  (ZOFRAN  ODT) 4 MG disintegrating tablet Take 1 tablet (4 mg total) by mouth every 8 (eight) hours as needed for nausea or vomiting. 12/10/15   Zada Elouise BROCKS, PA-C                                                                                                                                    Past Surgical History Past Surgical History:  Procedure Laterality Date   CERVICAL BIOPSY  W/ LOOP ELECTRODE EXCISION     CESAREAN SECTION     CESAREAN SECTION N/A 10/04/2013   Procedure: CESAREAN SECTION;  Surgeon: Glenys GORMAN Birk, MD;  Location: [facility];  Service: Obstetrics;  Laterality: N/A;   Family History Family History  Problem Relation Age of Onset   Diabetes Mother    Lupus Mother  Heart disease Mother     Social History Social History   Tobacco Use   Smoking status: Never   Smokeless tobacco: Never  Vaping Use   Vaping status: Never Used  Substance Use Topics   Alcohol use: Yes    Comment: occ   Drug use: No   Allergies Penicillins  Review of Systems A thorough review of systems was obtained and all systems are negative except as noted in the HPI and PMH.   Physical Exam Vital Signs  I have reviewed the triage vital signs BP 112/74   Pulse 78   Temp 98.8 F (37.1 C) (Oral)   Resp 18   LMP 05/16/2024   SpO2 98%  Physical Exam Vitals and nursing note reviewed.  Constitutional:      General: She is not in acute distress.    Appearance: Normal appearance. She is obese.  HENT:     Head: Normocephalic and atraumatic.     Right Ear: External ear normal.     Left Ear: External ear normal.     Nose: Nose normal.     Mouth/Throat:     Mouth: Mucous membranes are moist.  Eyes:     General: No scleral icterus.       Right eye: No discharge.        Left eye: No discharge.  Cardiovascular:      Rate and Rhythm: Regular rhythm. Tachycardia present.     Pulses: Normal pulses.     Heart sounds: Normal heart sounds.  Pulmonary:     Effort: Pulmonary effort is normal. No respiratory distress.     Breath sounds: Normal breath sounds. No stridor.  Abdominal:     General: Abdomen is flat. There is no distension.     Palpations: Abdomen is soft.     Tenderness: There is abdominal tenderness in the suprapubic area. There is right CVA tenderness. There is no guarding or rebound.   Musculoskeletal:     Cervical back: No rigidity.     Right lower leg: No edema.     Left lower leg: No edema.  Skin:    General: Skin is warm and dry.     Capillary Refill: Capillary refill takes less than 2 seconds.  Neurological:     Mental Status: She is alert.  Psychiatric:        Mood and Affect: Mood normal.        Behavior: Behavior normal. Behavior is cooperative.     ED Results and Treatments Labs (all labs ordered are listed, but only abnormal results are displayed) Labs Reviewed  CBC WITH DIFFERENTIAL/PLATELET - Abnormal; Notable for the following components:      Result Value   Hemoglobin 11.9 (*)    HCT 35.1 (*)    MCV 76.1 (*)    MCH 25.8 (*)    RDW 16.2 (*)    All other components within normal limits  COMPREHENSIVE METABOLIC PANEL WITH GFR - Abnormal; Notable for the following components:   Sodium 133 (*)    Glucose, Bld 136 (*)    Calcium 8.7 (*)    Total Protein 8.4 (*)    All other components within normal limits  URINALYSIS, ROUTINE W REFLEX MICROSCOPIC - Abnormal; Notable for the following components:   APPearance HAZY (*)    Hgb urine dipstick SMALL (*)    Protein, ur 30 (*)    Leukocytes,Ua LARGE (*)    All other components within normal limits  URINALYSIS, MICROSCOPIC (  REFLEX) - Abnormal; Notable for the following components:   Bacteria, UA MANY (*)    Non Squamous Epithelial PRESENT (*)    All other components within normal limits  RESP PANEL BY RT-PCR (RSV, FLU  A&B, COVID)  RVPGX2  URINE CULTURE  CULTURE, BLOOD (ROUTINE X 2)  CULTURE, BLOOD (ROUTINE X 2)  LIPASE, BLOOD  PREGNANCY, URINE  LACTIC ACID, PLASMA                                                                                                                          Radiology CT ABDOMEN PELVIS W CONTRAST Result Date: 05/24/2024 CLINICAL DATA:  Abdominal pain, nausea, diarrhea, and chills. EXAM: CT ABDOMEN AND PELVIS WITH CONTRAST TECHNIQUE: Multidetector CT imaging of the abdomen and pelvis was performed using the standard protocol following bolus administration of intravenous contrast. RADIATION DOSE REDUCTION: This exam was performed according to the departmental dose-optimization program which includes automated exposure control, adjustment of the mA and/or kV according to patient size and/or use of iterative reconstruction technique. CONTRAST:  OMNIPAQUE  IOHEXOL  300 MG/ML  SOLN COMPARISON:  03/11/2015. FINDINGS: Lower chest: No acute abnormality. Hepatobiliary: No focal liver abnormality is seen. No gallstones, gallbladder wall thickening, or biliary dilatation. Pancreas: Unremarkable. No pancreatic ductal dilatation or surrounding inflammatory changes. Spleen: Normal in size without focal abnormality. Adrenals/Urinary Tract: The adrenal glands are within normal limits. There is patchy hypoenhancement of the right kidney with perinephric fat stranding. A subcentimeter hypodensity is present on the left which is too small to further characterize. No renal calculus or obstructive uropathy bilaterally. The bladder is within normal limits. Stomach/Bowel: The stomach is within normal limits. No bowel obstruction, free air, or pneumatosis is seen. A few scattered diverticula are present along the colon without evidence of diverticulitis. Appendix appears normal. Vascular/Lymphatic: No significant vascular findings are present. No enlarged abdominal or pelvic lymph nodes. Reproductive: Uterus and  bilateral adnexa are unremarkable. Other: No abdominopelvic ascites. Musculoskeletal: No acute osseous abnormality. IMPRESSION: Patchy hypoenhancement of the right kidney with perinephric fat stranding, compatible with pyelonephritis. No obstructive uropathy bilaterally. Electronically Signed   By: Leita Birmingham M.D.   On: 05/24/2024 14:14    Pertinent labs & imaging results that were available during my care of the patient were reviewed by me and considered in my medical decision making (see MDM for details).  Medications Ordered in ED Medications  ondansetron  (ZOFRAN ) injection 4 mg (has no administration in time range)  acetaminophen  (TYLENOL ) tablet 1,000 mg (1,000 mg Oral Given 05/24/24 1217)  sodium chloride  0.9 % bolus 1,000 mL (0 mLs Intravenous Stopped 05/24/24 1439)  cefTRIAXone  (ROCEPHIN ) 2 g in sodium chloride  0.9 % 100 mL IVPB (0 g Intravenous Stopped 05/24/24 1414)  iohexol  (OMNIPAQUE ) 300 MG/ML solution 100 mL (100 mLs Intravenous Contrast Given 05/24/24 1307)  ketorolac  (TORADOL ) 15 MG/ML injection 15 mg (15 mg Intravenous Given 05/24/24 1437)  Procedures .Critical Care  Performed by: Elnor Jayson LABOR, DO Authorized by: Elnor Jayson LABOR, DO   Critical care provider statement:    Critical care time (minutes):  30   Critical care time was exclusive of:  Separately billable procedures and treating other patients   Critical care was necessary to treat or prevent imminent or life-threatening deterioration of the following conditions:  Sepsis   Critical care was time spent personally by me on the following activities:  Development of treatment plan with patient or surrogate, discussions with consultants, evaluation of patient's response to treatment, examination of patient, ordering and review of laboratory studies, ordering and review of radiographic studies,  ordering and performing treatments and interventions, pulse oximetry, re-evaluation of patient's condition, review of old charts and obtaining history from patient or surrogate   Care discussed with: admitting provider     (including critical care time)  Medical Decision Making / ED Course    Medical Decision Making:    Mio Schellinger is a 38 y.o. female with past medical history as below, significant for obesity, C-section who presents to the ED with complaint of abdominal pain nausea vomiting. The complaint involves an extensive differential diagnosis and also carries with it a high risk of complications and morbidity.  Serious etiology was considered. Ddx includes but is not limited to: Differential diagnosis includes but is not exclusive to ectopic pregnancy, ovarian cyst, ovarian torsion, acute appendicitis, urinary tract infection, endometriosis, bowel obstruction, hernia, colitis, renal colic, gastroenteritis, volvulus, community-acquired pneumonia, urinary tract infection, acute cholecystitis, viral syndrome, cellulitis, tick bourne disease,  decubitus ulcer, necrotizing fasciitis, meningitis, encephalitis, influenza, etc.   Complete initial physical exam performed, notably the patient was in no acute distress, she is tachycardic.    Reviewed and confirmed nursing documentation for past medical history, family history, social history.  Vital signs reviewed.    UTI Pyelonephritis  Sepsis > - Patient with UTI symptoms over the past few days, nausea vomiting flank pain.  She has very mild CVA tenderness, not peritoneal.  She is febrile on arrival and tachycardic.  Meets criteria for sepsis, this was activated - Patient with presumed pyelonephritis, started on Rocephin , urine and blood culture sent.  Also given IV fluids.  No leukocytosis.  Her lactic acid is not acutely elevated, she is HDS, does not appear to have septic shock  Pt with pyelonephritis, sepsis; she is HDS. She  received rocephin , cultures sent, pt having ongoing nausea, pain; recommend admission for pyelonephritis/sepsis; pt / family agreeable                      Additional history obtained: -Additional history obtained from family -External records from outside source obtained and reviewed including: Chart review including previous notes, labs, imaging, consultation notes including  Allergy list, prior medications   Lab Tests: -I ordered, reviewed, and interpreted labs.   The pertinent results include:   Labs Reviewed  CBC WITH DIFFERENTIAL/PLATELET - Abnormal; Notable for the following components:      Result Value   Hemoglobin 11.9 (*)    HCT 35.1 (*)    MCV 76.1 (*)    MCH 25.8 (*)    RDW 16.2 (*)    All other components within normal limits  COMPREHENSIVE METABOLIC PANEL WITH GFR - Abnormal; Notable for the following components:   Sodium 133 (*)    Glucose, Bld 136 (*)    Calcium 8.7 (*)    Total Protein 8.4 (*)  All other components within normal limits  URINALYSIS, ROUTINE W REFLEX MICROSCOPIC - Abnormal; Notable for the following components:   APPearance HAZY (*)    Hgb urine dipstick SMALL (*)    Protein, ur 30 (*)    Leukocytes,Ua LARGE (*)    All other components within normal limits  URINALYSIS, MICROSCOPIC (REFLEX) - Abnormal; Notable for the following components:   Bacteria, UA MANY (*)    Non Squamous Epithelial PRESENT (*)    All other components within normal limits  RESP PANEL BY RT-PCR (RSV, FLU A&B, COVID)  RVPGX2  URINE CULTURE  CULTURE, BLOOD (ROUTINE X 2)  CULTURE, BLOOD (ROUTINE X 2)  LIPASE, BLOOD  PREGNANCY, URINE  LACTIC ACID, PLASMA    Notable for as above  EKG   EKG Interpretation Date/Time:    Ventricular Rate:    PR Interval:    QRS Duration:    QT Interval:    QTC Calculation:   R Axis:      Text Interpretation:           Imaging Studies ordered: I ordered imaging studies including CTAP I independently  visualized the following imaging with scope of interpretation limited to determining acute life threatening conditions related to emergency care; findings noted above I agree with the radiologist interpretation If any imaging was obtained with contrast I closely monitored patient for any possible adverse reaction a/w contrast administration in the emergency department   Medicines ordered and prescription drug management: Meds ordered this encounter  Medications   acetaminophen  (TYLENOL ) tablet 1,000 mg   sodium chloride  0.9 % bolus 1,000 mL   cefTRIAXone  (ROCEPHIN ) 2 g in sodium chloride  0.9 % 100 mL IVPB    Antibiotic Indication::   UTI   iohexol  (OMNIPAQUE ) 300 MG/ML solution 100 mL   ketorolac  (TORADOL ) 15 MG/ML injection 15 mg   ondansetron  (ZOFRAN ) injection 4 mg    -I have reviewed the patients home medicines and have made adjustments as needed   Consultations Obtained: I requested consultation with the [facility],  and discussed lab and imaging findings as well as pertinent plan    Cardiac Monitoring:  Continuous pulse oximetry interpreted by myself, 98% on RA.    Social Determinants of Health:  Diagnosis or treatment significantly limited by social determinants of health: obesity   Reevaluation: After the interventions noted above, I reevaluated the patient and found that they have improved  Co morbidities that complicate the patient evaluation  Past Medical History:  Diagnosis Date   Heart murmur    Obesity       Dispostion: Disposition decision including need for hospitalization was considered, and patient admitted to the hospital.    Final Clinical Impression(s) / ED Diagnoses Final diagnoses:  Pyelonephritis  Sepsis, due to unspecified organism, unspecified whether acute organ dysfunction present (HCC)        Elnor Jayson LABOR, DO 05/24/24 1539

## 2024-05-24 NOTE — ED Notes (Signed)
 Provider fine with single culture due to starting antbx.

## 2024-05-24 NOTE — ED Notes (Signed)
 1 set cultures and LA collected with labs

## 2024-05-24 NOTE — ED Notes (Signed)
 [facility] called for transport to [facility] @17 :41

## 2024-05-24 NOTE — Subjective & Objective (Signed)
 Patient presents with abdominal pain nausea vomiting and chills for the past 4 days reports suprapubic abdominal discomfort as well as some right flank pain she had a few episodes of emesis reports dysuria and urgency but no hematuria also endorses some diarrhea no melena no bright red blood per rectum past history of obesity

## 2024-05-24 NOTE — Assessment & Plan Note (Signed)
-   treat with Rocephin         await results of urine culture and adjust antibiotic coverage as needed  

## 2024-05-24 NOTE — Assessment & Plan Note (Signed)
 Now improving  Will send for gastric panel  Started prior to exposure to abx

## 2024-05-24 NOTE — Assessment & Plan Note (Signed)
 Obtain anemia panel  Transfuse for Hg <7 , rapidly dropping or  if symptomatic

## 2024-05-24 NOTE — H&P (Signed)
 Donna Hayes FMW:[id number] DOB: March 24, 1986 DOA: 05/24/2024     PCP: Nicholaus Aleck LABOR, PA-C (Inactive)     Patient arrived to ER on 05/24/24 at 1150 Referred by Attending Patsy Lenis, MD   Patient coming from:    home Lives  With family    Chief Complaint:   Chief Complaint  Patient presents with   Abdominal Pain    HPI: Donna Hayes is a 38 y.o. female with medical history significant of morbid obesity, HLD,     Presented with abdominal pain dysuria Patient presents with abdominal pain nausea vomiting and chills for the past 4 days reports suprapubic abdominal discomfort as well as some right flank pain she had a few episodes of emesis reports dysuria and urgency but no hematuria also endorses some diarrhea no melena no bright red blood per rectum past history of obesity     Denies significant ETOH intake   Does not smoke      Regarding pertinent Chronic problems:    Hyperlipidemia -  not on statin      Morbid obesity-   BMI Readings from Last 1 Encounters:  11/23/22 48.60 kg/m     Chronic anemia - baseline hg Hemoglobin & Hematocrit  Recent Labs    05/24/24 1201  HGB 11.9*     While in ER:     CT showed pyelonephritis    Lab Orders         Resp panel by RT-PCR (RSV, Flu A&B, Covid) Anterior Nasal Swab         Urine Culture         Blood culture (routine x 2)         CBC with Differential         Comprehensive metabolic panel         Lipase, blood         Urinalysis, Routine w reflex microscopic -Urine, Clean Catch         Pregnancy, urine         Urinalysis, Microscopic (reflex)       CTabd/pelvis - Patchy hypoenhancement of the right kidney with perinephric fat stranding, compatible with pyelonephritis. No obstructive uropathy bilaterally.    Following Medications were ordered in ER: Medications  acetaminophen  (TYLENOL ) tablet 1,000 mg (1,000 mg Oral Given 05/24/24 1217)  sodium chloride  0.9 % bolus 1,000 mL (0 mLs Intravenous  Stopped 05/24/24 1439)  cefTRIAXone  (ROCEPHIN ) 2 g in sodium chloride  0.9 % 100 mL IVPB (0 g Intravenous Stopped 05/24/24 1414)  iohexol  (OMNIPAQUE ) 300 MG/ML solution 100 mL (100 mLs Intravenous Contrast Given 05/24/24 1307)  ketorolac  (TORADOL ) 15 MG/ML injection 15 mg (15 mg Intravenous Given 05/24/24 1437)  ondansetron  (ZOFRAN ) injection 4 mg (4 mg Intravenous Given 05/24/24 1546)        ED Triage Vitals  Encounter Vitals Group     BP 05/24/24 1159 130/88     Girls Systolic BP Percentile --      Girls Diastolic BP Percentile --      Boys Systolic BP Percentile --      Boys Diastolic BP Percentile --      Pulse Rate 05/24/24 1159 (!) 133     Resp 05/24/24 1159 16     Temp 05/24/24 1159 (!) 103.2 F (39.6 C)     Temp Source 05/24/24 1159 Oral     SpO2 05/24/24 1159 97 %     Weight --      Height --  Head Circumference --      Peak Flow --      Pain Score 05/24/24 1156 7     Pain Loc --      Pain Education --      Exclude from Growth Chart --   UFJK(75)@     _________________________________________ Significant initial  Findings: Abnormal Labs Reviewed  CBC WITH DIFFERENTIAL/PLATELET - Abnormal; Notable for the following components:      Result Value   Hemoglobin 11.9 (*)    HCT 35.1 (*)    MCV 76.1 (*)    MCH 25.8 (*)    RDW 16.2 (*)    All other components within normal limits  COMPREHENSIVE METABOLIC PANEL WITH GFR - Abnormal; Notable for the following components:   Sodium 133 (*)    Glucose, Bld 136 (*)    Calcium 8.7 (*)    Total Protein 8.4 (*)    All other components within normal limits  URINALYSIS, ROUTINE W REFLEX MICROSCOPIC - Abnormal; Notable for the following components:   APPearance HAZY (*)    Hgb urine dipstick SMALL (*)    Protein, ur 30 (*)    Leukocytes,Ua LARGE (*)    All other components within normal limits  URINALYSIS, MICROSCOPIC (REFLEX) - Abnormal; Notable for the following components:   Bacteria, UA MANY (*)    Non Squamous Epithelial  PRESENT (*)    All other components within normal limits    __  WBC     Component Value Date/Time   WBC 7.7 05/24/2024 1201   LYMPHSABS 1.4 05/24/2024 1201   MONOABS 0.8 05/24/2024 1201   EOSABS 0.0 05/24/2024 1201   BASOSABS 0.0 05/24/2024 1201     Lactic Acid, Venous    Component Value Date/Time   LATICACIDVEN 0.8 05/24/2024 1211      UA   evidence of UTI     Urine analysis:    Component Value Date/Time   COLORURINE YELLOW 05/24/2024 1201   APPEARANCEUR HAZY (A) 05/24/2024 1201   LABSPEC 1.010 05/24/2024 1201   PHURINE 6.0 05/24/2024 1201   GLUCOSEU NEGATIVE 05/24/2024 1201   HGBUR SMALL (A) 05/24/2024 1201   BILIRUBINUR NEGATIVE 05/24/2024 1201   KETONESUR NEGATIVE 05/24/2024 1201   PROTEINUR 30 (A) 05/24/2024 1201   UROBILINOGEN 0.2 03/11/2015 1851   NITRITE NEGATIVE 05/24/2024 1201   LEUKOCYTESUR LARGE (A) 05/24/2024 1201    Results for orders placed or performed during the hospital encounter of 05/24/24  Resp panel by RT-PCR (RSV, Flu A&B, Covid) Anterior Nasal Swab     Status: None   Collection Time: 05/24/24 12:01 PM   Specimen: Anterior Nasal Swab  Result Value Ref Range Status   SARS Coronavirus 2 by RT PCR NEGATIVE NEGATIVE Final         Influenza A by PCR NEGATIVE NEGATIVE Final   Influenza B by PCR NEGATIVE NEGATIVE Final         Resp Syncytial Virus by PCR NEGATIVE NEGATIVE Final          ABX started Antibiotics Given (last 72 hours)     Date/Time Action Medication Dose Rate   05/24/24 1339 New Bag/Given   cefTRIAXone  (ROCEPHIN ) 2 g in sodium chloride  0.9 % 100 mL IVPB 2 g 200 mL/hr       __________________________________________________________ Recent Labs  Lab 05/24/24 1201  NA 133*  K 4.5  CO2 24  GLUCOSE 136*  BUN 10  CREATININE 1.00  CALCIUM 8.7*    Cr  stable,    Lab Results  Component Value Date   CREATININE 1.00 05/24/2024   CREATININE 0.88 11/23/2022   CREATININE 0.92 03/11/2015    Recent Labs  Lab  05/24/24 1201  AST 33  ALT 28  ALKPHOS 81  BILITOT 0.5  PROT 8.4*  ALBUMIN 3.9   Lab Results  Component Value Date   CALCIUM 8.7 (L) 05/24/2024        Plt: Lab Results  Component Value Date   PLT 265 05/24/2024       Recent Labs  Lab 05/24/24 1201  WBC 7.7  NEUTROABS 5.5  HGB 11.9*  HCT 35.1*  MCV 76.1*  PLT 265    HG/HCT  stable     Component Value Date/Time   HGB 11.9 (L) 05/24/2024 1201   HGB 10.3 07/01/2013 0000   HCT 35.1 (L) 05/24/2024 1201   HCT 37 03/04/2013 0000   HCT 37 03/04/2013 0000   MCV 76.1 (L) 05/24/2024 1201     Recent Labs  Lab 05/24/24 1201  LIPASE 19    ______________________________________ Hospitalist was called for admission for   Pyelonephritis  Sepsis, due to unspecified organism    The following Work up has been ordered so far:  Orders Placed This Encounter  Procedures   Critical Care   Resp panel by RT-PCR (RSV, Flu A&B, Covid) Anterior Nasal Swab   Urine Culture   Blood culture (routine x 2)   CT ABDOMEN PELVIS W CONTRAST   CBC with Differential   Comprehensive metabolic panel   Lipase, blood   Urinalysis, Routine w reflex microscopic -Urine, Clean Catch   Pregnancy, urine   Urinalysis, Microscopic (reflex)   Cardiac Monitoring - Continuous Indefinite   Code Sepsis activation.  This occurs automatically when order is signed and prioritizes pharmacy, lab, and radiology services for STAT collections and interventions.  If [facility] downtime, call [facility] [phone]) to activate Code Sepsis.   Consult to hospitalist   Insert peripheral IV   Admit to Inpatient (patient's expected length of stay will be greater than 2 midnights or inpatient only procedure)     OTHER Significant initial  Findings:  labs showing:     DM  labs:  HbA1C: No results for input(s): HGBA1C in the last 8760 hours.     CBG (last 3)  No results for input(s): GLUCAP in the last 72 hours.        Cultures:    Component Value Date/Time    SDES URINE, CLEAN CATCH 03/11/2015 1851   SPECREQUEST NONE 03/11/2015 1851   CULT  03/11/2015 1851    >=100,000 COLONIES/mL CITROBACTER KOSERI Performed at [facility]    REPTSTATUS 03/14/2015 FINAL 03/11/2015 1851     Radiological Exams on Admission: CT ABDOMEN PELVIS W CONTRAST Result Date: 05/24/2024 CLINICAL DATA:  Abdominal pain, nausea, diarrhea, and chills. EXAM: CT ABDOMEN AND PELVIS WITH CONTRAST TECHNIQUE: Multidetector CT imaging of the abdomen and pelvis was performed using the standard protocol following bolus administration of intravenous contrast. RADIATION DOSE REDUCTION: This exam was performed according to the departmental dose-optimization program which includes automated exposure control, adjustment of the mA and/or kV according to patient size and/or use of iterative reconstruction technique. CONTRAST:  OMNIPAQUE  IOHEXOL  300 MG/ML  SOLN COMPARISON:  03/11/2015. FINDINGS: Lower chest: No acute abnormality. Hepatobiliary: No focal liver abnormality is seen. No gallstones, gallbladder wall thickening, or biliary dilatation. Pancreas: Unremarkable. No pancreatic ductal dilatation or surrounding inflammatory changes. Spleen: Normal in size without focal  abnormality. Adrenals/Urinary Tract: The adrenal glands are within normal limits. There is patchy hypoenhancement of the right kidney with perinephric fat stranding. A subcentimeter hypodensity is present on the left which is too small to further characterize. No renal calculus or obstructive uropathy bilaterally. The bladder is within normal limits. Stomach/Bowel: The stomach is within normal limits. No bowel obstruction, free air, or pneumatosis is seen. A few scattered diverticula are present along the colon without evidence of diverticulitis. Appendix appears normal. Vascular/Lymphatic: No significant vascular findings are present. No enlarged abdominal or pelvic lymph nodes. Reproductive: Uterus and bilateral adnexa are  unremarkable. Other: No abdominopelvic ascites. Musculoskeletal: No acute osseous abnormality. IMPRESSION: Patchy hypoenhancement of the right kidney with perinephric fat stranding, compatible with pyelonephritis. No obstructive uropathy bilaterally. Electronically Signed   By: Leita Birmingham M.D.   On: 05/24/2024 14:14   _______________________________________________________________________________________________________ Latest  Blood pressure 114/87, pulse 74, temperature 98.2 F (36.8 C), resp. rate 18, last menstrual period 05/16/2024, SpO2 100%.   Vitals  labs and radiology finding personally reviewed  Review of Systems:    Pertinent positives include:  Fevers, chills, fatigue abdominal pain, nausea, vomiting, diarrhea, Constitutional:  No weight loss, night sweats, , weight loss  HEENT:  No headaches, Difficulty swallowing,Tooth/dental problems,Sore throat,  No sneezing, itching, ear ache, nasal congestion, post nasal drip,  Cardio-vascular:  No chest pain, Orthopnea, PND, anasarca, dizziness, palpitations.no Bilateral lower extremity swelling  GI:  No heartburn, indigestion,  change in bowel habits, loss of appetite, melena, blood in stool, hematemesis Resp:  no shortness of breath at rest. No dyspnea on exertion, No excess mucus, no productive cough, No non-productive cough, No coughing up of blood.No change in color of mucus.No wheezing. Skin:  no rash or lesions. No jaundice GU:  no dysuria, change in color of urine, no urgency or frequency. No straining to urinate.  No flank pain.  Musculoskeletal:  No joint pain or no joint swelling. No decreased range of motion. No back pain.  Psych:  No change in mood or affect. No depression or anxiety. No memory loss.  Neuro: no localizing neurological complaints, no tingling, no weakness, no double vision, no gait abnormality, no slurred speech, no confusion  All systems reviewed and apart from HOPI all are  negative _______________________________________________________________________________________________ Past Medical History:   Past Medical History:  Diagnosis Date   Heart murmur    Obesity       Past Surgical History:  Procedure Laterality Date   CERVICAL BIOPSY  W/ LOOP ELECTRODE EXCISION     CESAREAN SECTION     CESAREAN SECTION N/A 10/04/2013   Procedure: CESAREAN SECTION;  Surgeon: Glenys GORMAN Birk, MD;  Location: [facility];  Service: Obstetrics;  Laterality: N/A;    Social History:  Ambulatory   independently       reports that she has never smoked. She has never used smokeless tobacco. She reports current alcohol use. She reports that she does not use drugs.   Family History:   Family History  Problem Relation Age of Onset   Diabetes Mother    Lupus Mother    Heart disease Mother    ______________________________________________________________________________________________ Allergies: Allergies  Allergen Reactions   Penicillins Hives     Prior to Admission medications   Medication Sig Start Date End Date Taking? Authorizing Provider  benzonatate  (TESSALON ) 100 MG capsule Take 1 capsule (100 mg total) by mouth every 8 (eight) hours. 12/10/15   Joy, Shawn C, PA-C  dexamethasone  (DECADRON ) 6 MG tablet  Take 1 tablet (6 mg total) by mouth daily. 09/14/19   Patsey Lot, MD  ibuprofen  (ADVIL ) 600 MG tablet Take 1 tablet (600 mg total) by mouth every 6 (six) hours as needed. 11/23/22   Charlyn Sora, MD  methocarbamol  (ROBAXIN ) 500 MG tablet Take 1 tablet (500 mg total) by mouth 2 (two) times daily. 05/26/18   Kehrli, Kelsey F, PA-C  ondansetron  (ZOFRAN  ODT) 4 MG disintegrating tablet Take 1 tablet (4 mg total) by mouth every 8 (eight) hours as needed for nausea or vomiting. 03/11/15   Patsey Satchel, NP  ondansetron  (ZOFRAN  ODT) 4 MG disintegrating tablet Take 1 tablet (4 mg total) by mouth every 8 (eight) hours as needed for nausea or vomiting. 12/10/15   Zada Elouise BROCKS, PA-C    ___________________________________________________________________________________________________ Physical Exam:    05/24/2024    7:09 PM 05/24/2024    6:15 PM 05/24/2024    5:45 PM  Vitals with BMI  Systolic 114 105 897  Diastolic 87 57 65  Pulse 74       1. General:  in No  Acute distress    Chronically ill  -appearing 2. Psychological: Alert and   Oriented 3. Head/ENT:   Dry Mucous Membranes                          Head Non traumatic, neck supple                           Poor Dentition 4. SKIN: normal   Skin turgor,  Skin clean Dry and intact no rash    5. Heart: Regular rate and rhythm no  Murmur, no Rub or gallop 6. Lungs:   no wheezes or crackles   7. Abdomen: Soft,  non-tender, Non distended   obese  bowel sounds present 8. Lower extremities: no clubbing, cyanosis, no  edema 9. Neurologically Grossly intact, moving all 4 extremities equally   10. MSK: Normal range of motion    Chart has been reviewed  ______________________________________________________________________________________________  Assessment/Plan 38 y.o. female with medical history significant of morbid obesity, HLD,    Admitted for  Pyelonephritis  Sepsis, due to unspecified organism    Present on Admission:  Acute pyelonephritis  Anemia  Obesity (BMI 30-39.9)  Sepsis (HCC)  Diarrhea     Acute pyelonephritis  - treat with Rocephin          await results of urine culture and adjust antibiotic coverage as needed   Anemia Obtain anemia panel  Transfuse for Hg <7 , rapidly dropping or  if symptomatic   Obesity (BMI 30-39.9) Contributing to comorbidity and complicating medical management     Nutritional follow up as an out pt would be recommended   Sepsis (HCC) Sepsis -sepsis physiology now resolved Continue rehydrating continue antibiotics for now and will continue to monitor And continue to monitor urine and blood cultures  Diarrhea Now improving  Will send  for gastric panel  Started prior to exposure to abx    Other plan as per orders.  DVT prophylaxis:  SCD       Code Status:    Code Status: Prior FULL CODE as per patient   I had personally discussed CODE STATUS with patient   ACP   none   Family Communication:   Family not at  Bedside    Diet regular   Disposition Plan:       To  home once workup is complete and patient is stable   Following barriers for discharge:                             Afebrile,  able to transition to PO antibiotics                               Anemia  stable                             Pain controlled with PO medications                                                          Will need to be able to tolerate PO                                 Consult Orders  (From admission, onward)           Start     Ordered   05/24/24 1537  Consult to hospitalist  Called [facility] for consult to Hospitalist @15 :51.  Spoke with Tinnie  Once       Provider:  (Not yet assigned)  Question Answer Comment  Place call to: [facility]   Reason for Consult Admit      05/24/24 1536              Admission status:  ED Disposition     ED Disposition  Admit   Condition  --   Comment  Hospital Area: [facility] [[id number]]  Level of Care: Telemetry [5]  Admit to tele based on following criteria: Complex arrhythmia (Bradycardia/Tachycardia)  May admit patient to [facility] or [facility] if equivalent level of care is available:: Yes  Interfacility transfer: Yes  Covid Evaluation: Asymptomatic - no recent exposure (last 10 days) testing not required  Diagnosis: Acute pyelonephritis [258431]  Admitting Physician: PATSY LENIS [phone]  Attending Physician: PATSY LENIS [phone]  Certification:: I certify this patient will need inpatient services for at least 2 midnights  Expected Medical Readiness: 05/26/2024           I Expect 2 midnight stay secondary to severity of patient's  current illness need for inpatient interventions justified by the following:  hemodynamic instability despite optimal treatment (tachycardia  )   Severe lab/radiological/exam abnormalities including:    Pyelonephritis  Sepsis,   and extensive comorbidities including:   Morbid Obesity   That are currently affecting medical management.   I expect  patient to be hospitalized for 2 midnights requiring inpatient medical care.  Patient is at high risk for adverse outcome (such as loss of life or disability) if not treated.  Indication for inpatient stay as follows:   Hemodynamic instability despite maximal medical therapy,    Need for IV antibiotics, IV fluids,     Level of care     tele  For  24H    Tailyn Hantz 05/24/2024, 8:18 PM     [facility]     after 2 AM please page floor coverage   If 7AM-7PM, please contact  the day team taking care of the patient using [email or web address]

## 2024-05-24 NOTE — Progress Notes (Signed)
[facility] is following code sepsis bundle.

## 2024-05-24 NOTE — Assessment & Plan Note (Signed)
 Sepsis -sepsis physiology now resolved Continue rehydrating continue antibiotics for now and will continue to monitor And continue to monitor urine and blood cultures

## 2024-05-24 NOTE — ED Triage Notes (Signed)
 Pt c/o mid ABD pain with n/v/d, chills x 4 days.

## 2024-05-24 NOTE — Assessment & Plan Note (Signed)
 Contributing to comorbidity and complicating medical management     Nutritional follow up as an out pt would be recommended

## 2024-05-24 NOTE — ED Notes (Signed)
 Called lab for lactic orders being placed. Sample was already in lab.

## 2024-05-24 NOTE — Plan of Care (Signed)
 Plan of Care Note for accepted transfer   Patient: Donna Hayes MRN: [id number]   DOA: 05/24/2024  Facility requesting transfer: [facility] Requesting Provider: Dr. Elnor Reason for transfer: abd pain/pyelo Facility course:  38 year old female presenting with abdominal pain and nausea/vomiting.  Unable to take in adequate oral nutrition for approximately 4 days. On workup found to be febrile, 103.2 F and tachycardic heart rate 133 initially.  Remainder of vitals unremarkable. No leukocytosis, WBC 7.7. Urinalysis notable for large LE, negative nitrite, greater than 50 WBC, many bacteria.  CT abdomen/pelvis showed patchy hypoenhancement of the right kidney with perinephric fat stranding compatible with pyelonephritis.  No obstructions noted.  She was treated with fluids and Rocephin .  She is admitted for ongoing treatment given poor ability for nutrition.   Plan of care: The patient is accepted for admission to [facility] or [facility] Telemetry unit  Author: Alm Apo, MD 05/24/2024 4:18 PM   **Nursing staff**, Please call [facility] Admits & Consults System-Wide number on Amion as soon as patient's arrival, so appropriate admitting provider can evaluate the pt.  Check [email or web address] for on-call coverage

## 2024-05-25 DIAGNOSIS — E669 Obesity, unspecified: Secondary | ICD-10-CM | POA: Diagnosis not present

## 2024-05-25 DIAGNOSIS — N10 Acute tubulo-interstitial nephritis: Secondary | ICD-10-CM | POA: Diagnosis not present

## 2024-05-25 DIAGNOSIS — D509 Iron deficiency anemia, unspecified: Secondary | ICD-10-CM

## 2024-05-25 LAB — FOLATE: Folate: 19.8 ng/mL (ref >5.9)

## 2024-05-25 LAB — BLOOD CULTURE ID PANEL (REFLEXED) - BCID2

## 2024-05-25 LAB — CBC
HCT: 29.6 % — ABNORMAL LOW (ref 36.0–46.0)
Hemoglobin: 9.6 g/dL — ABNORMAL LOW (ref 12.0–15.0)
MCH: 25.3 pg — ABNORMAL LOW (ref 26.0–34.0)
MCHC: 32.4 g/dL (ref 30.0–36.0)
MCV: 78.1 fL — ABNORMAL LOW (ref 80.0–100.0)
Platelets: 231 K/uL (ref 150–400)
RBC: 3.79 MIL/uL — ABNORMAL LOW (ref 3.87–5.11)
RDW: 16.4 % — ABNORMAL HIGH (ref 11.5–15.5)
WBC: 6.4 K/uL (ref 4.0–10.5)
nRBC: 0 % (ref 0.0–0.2)

## 2024-05-25 LAB — IRON AND TIBC
Iron: 13 ug/dL — ABNORMAL LOW (ref 28–170)
Saturation Ratios: 5 % — ABNORMAL LOW (ref 10.4–31.8)
TIBC: 253 ug/dL (ref 250–450)
UIBC: 240 ug/dL

## 2024-05-25 LAB — COMPREHENSIVE METABOLIC PANEL WITH GFR
ALT: 19 U/L (ref 0–44)
AST: 19 U/L (ref 15–41)
Albumin: 3.3 g/dL — ABNORMAL LOW (ref 3.5–5.0)
Alkaline Phosphatase: 65 U/L (ref 38–126)
Anion gap: 11 (ref 5–15)
BUN: 8 mg/dL (ref 6–20)
CO2: 21 mmol/L — ABNORMAL LOW (ref 22–32)
Calcium: 8 mg/dL — ABNORMAL LOW (ref 8.9–10.3)
Chloride: 105 mmol/L (ref 98–111)
Creatinine, Ser: 0.85 mg/dL (ref 0.44–1.00)
GFR, Estimated: >60 mL/min (ref >60)
Glucose, Bld: 114 mg/dL — ABNORMAL HIGH (ref 70–99)
Potassium: 3.9 mmol/L (ref 3.5–5.1)
Sodium: 137 mmol/L (ref 135–145)
Total Bilirubin: 0.3 mg/dL (ref 0.0–1.2)
Total Protein: 6.7 g/dL (ref 6.5–8.1)

## 2024-05-25 LAB — HIV ANTIBODY (ROUTINE TESTING W REFLEX): HIV Screen 4th Generation wRfx: NONREACTIVE

## 2024-05-25 LAB — RETICULOCYTES
Immature Retic Fract: 9.2 % (ref 2.3–15.9)
RBC.: 3.77 MIL/uL — ABNORMAL LOW (ref 3.87–5.11)
Retic Count, Absolute: 26.4 K/uL (ref 19.0–186.0)
Retic Ct Pct: 0.7 % (ref 0.4–3.1)

## 2024-05-25 LAB — MAGNESIUM: Magnesium: 2.4 mg/dL (ref 1.7–2.4)

## 2024-05-25 LAB — FERRITIN: Ferritin: 102 ng/mL (ref 11–307)

## 2024-05-25 LAB — PHOSPHORUS: Phosphorus: 2.8 mg/dL (ref 2.5–4.6)

## 2024-05-25 LAB — VITAMIN B12: Vitamin B-12: 314 pg/mL (ref 180–914)

## 2024-05-25 MED ORDER — SODIUM CHLORIDE 0.9 % IV SOLN: INTRAVENOUS | Status: AC

## 2024-05-25 NOTE — Progress Notes (Signed)
 PHARMACY - PHYSICIAN COMMUNICATION CRITICAL VALUE ALERT - BLOOD CULTURE IDENTIFICATION (BCID)  Donna Hayes is an 38 y.o. female who presented to [facility] on 05/24/2024 with a chief complaint of abdominal pain, back pain and N/V  Assessment:   Bcx 1/4 staph species - suspect contaminant Pt on abx for acute pyelonephritis  Name of physician (or Provider) Contacted: Dr. Verdene  Current antibiotics: ceftriaxone   Changes to prescribed antibiotics recommended: none  Results for orders placed or performed during the hospital encounter of 05/24/24  Blood Culture ID Panel (Reflexed) (Collected: 05/24/2024 12:01 PM)  Result Value Ref Range   Enterococcus faecalis NOT DETECTED NOT DETECTED   Enterococcus Faecium NOT DETECTED NOT DETECTED   Listeria monocytogenes NOT DETECTED NOT DETECTED   Staphylococcus species DETECTED (A) NOT DETECTED   Staphylococcus aureus (BCID) NOT DETECTED NOT DETECTED   Staphylococcus epidermidis NOT DETECTED NOT DETECTED   Staphylococcus lugdunensis NOT DETECTED NOT DETECTED   Streptococcus species NOT DETECTED NOT DETECTED   Streptococcus agalactiae NOT DETECTED NOT DETECTED   Streptococcus pneumoniae NOT DETECTED NOT DETECTED   Streptococcus pyogenes NOT DETECTED NOT DETECTED   A.calcoaceticus-baumannii NOT DETECTED NOT DETECTED   Bacteroides fragilis NOT DETECTED NOT DETECTED   Enterobacterales NOT DETECTED NOT DETECTED   Enterobacter cloacae complex NOT DETECTED NOT DETECTED   Escherichia coli NOT DETECTED NOT DETECTED   Klebsiella aerogenes NOT DETECTED NOT DETECTED   Klebsiella oxytoca NOT DETECTED NOT DETECTED   Klebsiella pneumoniae NOT DETECTED NOT DETECTED   Proteus species NOT DETECTED NOT DETECTED   Salmonella species NOT DETECTED NOT DETECTED   Serratia marcescens NOT DETECTED NOT DETECTED   Haemophilus influenzae NOT DETECTED NOT DETECTED   Neisseria meningitidis NOT DETECTED NOT DETECTED   Pseudomonas aeruginosa NOT DETECTED NOT  DETECTED   Stenotrophomonas maltophilia NOT DETECTED NOT DETECTED   Candida albicans NOT DETECTED NOT DETECTED   Candida auris NOT DETECTED NOT DETECTED   Candida glabrata NOT DETECTED NOT DETECTED   Candida krusei NOT DETECTED NOT DETECTED   Candida parapsilosis NOT DETECTED NOT DETECTED   Candida tropicalis NOT DETECTED NOT DETECTED   Cryptococcus neoformans/gattii NOT DETECTED NOT DETECTED    Stefano MARLA Bologna, PharmD, BCPS Clinical Pharmacist 05/25/2024 2:57 PM

## 2024-05-25 NOTE — Plan of Care (Signed)
  Problem: Clinical Measurements: Goal: Diagnostic test results will improve Outcome: Progressing   Problem: Nutrition: Goal: Adequate nutrition will be maintained Outcome: Progressing   Problem: Elimination: Goal: Will not experience complications related to bowel motility Outcome: Progressing

## 2024-05-25 NOTE — Progress Notes (Addendum)
 [facility] PROGRESS NOTE   Donna Hayes FMW:[id number] DOB: 1986-06-15 DOA: 05/24/2024  PCP: Nicholaus Aleck LABOR, PA-C (Inactive)  Brief History: 38 y.o. female with medical history significant of morbid obesity, HLD, presented with abdominal pain, back pain nausea vomiting.  Found to have acute pyelonephritis.  She was hospitalized for further management.  Consultants: None yet  Procedures: None    Subjective/Interval History: Patient feels weak and fatigued.  Occasional back pain.  Denies any abdominal pain this morning.  Denies any dysuria this morning.    Assessment/Plan:  Acute pyelonephritis CT scan did not show any complicating features.  Noted to be febrile.  Follow-up on blood cultures.  WBC was surprisingly normal.  Lactic acid level was normal. Patient is on ceftriaxone . Blood work is stable this morning.  Microcytic anemia Her last menstrual period was last week. Anemia panel shows TIBC of 253, iron of 13.  Ferritin level is pending.  B12 level is 314 and folic acid is 80.1. If ferritin level is low then consideration can be given to initiating iron supplements. Drop in hemoglobin is likely dilutional.  No evidence of overt blood loss.  Recheck labs in the morning. Start B12 supplements as well.  Class III obesity Estimated body mass index is 48.6 kg/m as calculated from the following:   Height as of 11/23/22: 5' 2.5 (1.588 m).   Weight as of 11/23/22: 122.5 kg.   DVT Prophylaxis: SCDs for now.  Mobilize Code Status: Full code Family Communication: Discussed with patient Disposition Plan: Charge home when improved  Status is: Inpatient Remains inpatient appropriate because: Acute pyelonephritis requiring IV antibiotics      Medications: Scheduled: Continuous:  cefTRIAXone  (ROCEPHIN )  IV     PRN:acetaminophen  **OR** acetaminophen , fentaNYL  (SUBLIMAZE ) injection, HYDROcodone -acetaminophen , ondansetron  **OR** ondansetron  (ZOFRAN )  IV  Antibiotics: Anti-infectives (From admission, onward)    Start     Dose/Rate Route Frequency Ordered Stop   05/25/24 1300  cefTRIAXone  (ROCEPHIN ) 2 g in sodium chloride  0.9 % 100 mL IVPB        2 g 200 mL/hr over 30 Minutes Intravenous Every 24 hours 05/24/24 1943     05/24/24 1315  cefTRIAXone  (ROCEPHIN ) 2 g in sodium chloride  0.9 % 100 mL IVPB        2 g 200 mL/hr over 30 Minutes Intravenous  Once 05/24/24 1300 05/24/24 1414       Objective:  Vital Signs  Vitals:   05/25/24 0232 05/25/24 0332 05/25/24 0503 05/25/24 0808  BP:  107/62 101/63 121/75  Pulse:  95 85 84  Resp: (!) 22 20 (!) 24 18  Temp:  99.2 F (37.3 C) 98.6 F (37 C)   TempSrc:      SpO2:  97% 97% 98%    Intake/Output Summary (Last 24 hours) at 05/25/2024 0859 Last data filed at 05/25/2024 9392 Gross per 24 hour  Intake 2843.42 ml  Output --  Net 2843.42 ml   There were no vitals filed for this visit.  General appearance: Awake alert.  In no distress Resp: Clear to auscultation bilaterally.  Normal effort Cardio: S1-S2 is normal regular.  No S3-S4.  No rubs murmurs or bruit GI: Abdomen is soft.  Nontender nondistended.  Bowel sounds are present normal.  No masses organomegaly Extremities: No edema.  Full range of motion of lower extremities. Neurologic: Alert and oriented x3.  No focal neurological deficits.    Lab Results:  Data Reviewed: I have personally reviewed following labs and reports of the  imaging studies  CBC: Recent Labs  Lab 05/24/24 1201 05/25/24 0511  WBC 7.7 6.4  NEUTROABS 5.5  --   HGB 11.9* 9.6*  HCT 35.1* 29.6*  MCV 76.1* 78.1*  PLT 265 231    Basic Metabolic Panel: Recent Labs  Lab 05/24/24 1201 05/25/24 0511  NA 133* 137  K 4.5 3.9  CL 98 105  CO2 24 21*  GLUCOSE 136* 114*  BUN 10 8  CREATININE 1.00 0.85  CALCIUM 8.7* 8.0*  MG  --  2.4  PHOS  --  2.8    GFR: CrCl cannot be calculated (Unknown ideal weight.).  Liver Function Tests: Recent Labs   Lab 05/24/24 1201 05/25/24 0511  AST 33 19  ALT 28 19  ALKPHOS 81 65  BILITOT 0.5 0.3  PROT 8.4* 6.7  ALBUMIN 3.9 3.3*    Recent Labs  Lab 05/24/24 1201  LIPASE 19     Anemia Panel: Recent Labs    05/25/24 0511  VITAMINB12 314  FOLATE 19.8  TIBC 253  IRON 13*  RETICCTPCT 0.7    Recent Results (from the past 240 hours)  Resp panel by RT-PCR (RSV, Flu A&B, Covid) Anterior Nasal Swab     Status: None   Collection Time: 05/24/24 12:01 PM   Specimen: Anterior Nasal Swab  Result Value Ref Range Status   SARS Coronavirus 2 by RT PCR NEGATIVE NEGATIVE Final    Comment: (NOTE) SARS-CoV-2 target nucleic acids are NOT DETECTED.  The SARS-CoV-2 RNA is generally detectable in upper respiratory specimens during the acute phase of infection. The lowest concentration of SARS-CoV-2 viral copies this assay can detect is 138 copies/mL. A negative result does not preclude SARS-Cov-2 infection and should not be used as the sole basis for treatment or other patient management decisions. A negative result may occur with  improper specimen collection/handling, submission of specimen other than nasopharyngeal swab, presence of viral mutation(s) within the areas targeted by this assay, and inadequate number of viral copies(<138 copies/mL). A negative result must be combined with clinical observations, patient history, and epidemiological information. The expected result is Negative.  Fact Sheet for Patients:  BloggerCourse.com  Fact Sheet for Healthcare Providers:  SeriousBroker.it  This test is no t yet approved or cleared by the [place]  FDA and  has been authorized for detection and/or diagnosis of SARS-CoV-2 by FDA under an Emergency Use Authorization (EUA). This EUA will remain  in effect (meaning this test can be used) for the duration of the COVID-19 declaration under Section 564(b)(1) of the Act, 21 U.S.C.section  360bbb-3(b)(1), unless the authorization is terminated  or revoked sooner.       Influenza A by PCR NEGATIVE NEGATIVE Final   Influenza B by PCR NEGATIVE NEGATIVE Final    Comment: (NOTE) The Xpert Xpress SARS-CoV-2/FLU/RSV plus assay is intended as an aid in the diagnosis of influenza from Nasopharyngeal swab specimens and should not be used as a sole basis for treatment. Nasal washings and aspirates are unacceptable for Xpert Xpress SARS-CoV-2/FLU/RSV testing.  Fact Sheet for Patients: BloggerCourse.com  Fact Sheet for Healthcare Providers: SeriousBroker.it  This test is not yet approved or cleared by the [place]  FDA and has been authorized for detection and/or diagnosis of SARS-CoV-2 by FDA under an Emergency Use Authorization (EUA). This EUA will remain in effect (meaning this test can be used) for the duration of the COVID-19 declaration under Section 564(b)(1) of the Act, 21 U.S.C. section 360bbb-3(b)(1), unless the authorization is terminated or  revoked.     Resp Syncytial Virus by PCR NEGATIVE NEGATIVE Final    Comment: (NOTE) Fact Sheet for Patients: BloggerCourse.com  Fact Sheet for Healthcare Providers: SeriousBroker.it  This test is not yet approved or cleared by the [place]  FDA and has been authorized for detection and/or diagnosis of SARS-CoV-2 by FDA under an Emergency Use Authorization (EUA). This EUA will remain in effect (meaning this test can be used) for the duration of the COVID-19 declaration under Section 564(b)(1) of the Act, 21 U.S.C. section 360bbb-3(b)(1), unless the authorization is terminated or revoked.  Performed at [facility], [address] [place], [place] [address]   Blood culture (routine x 2)     Status: None (Preliminary result)   Collection Time: 05/24/24 12:01 PM   Specimen: BLOOD  Result Value Ref Range  Status   Specimen Description   Final    BLOOD RIGHT ANTECUBITAL Performed at [facility], [address] [place], [place] [address]    Special Requests   Final    BOTTLES DRAWN AEROBIC AND ANAEROBIC Blood Culture results may not be optimal due to an inadequate volume of blood received in culture bottles Performed at [facility], [address] [place], [place] [address]    Culture   Final    NO GROWTH < 24 HOURS Performed at [facility], [address], [place], [place] [address]    Report Status PENDING  Incomplete  Blood culture (routine x 2)     Status: None (Preliminary result)   Collection Time: 05/24/24  7:23 PM   Specimen: BLOOD LEFT ARM  Result Value Ref Range Status   Specimen Description   Final    BLOOD LEFT ARM Performed at [facility], [address] [place], [place] [address]    Special Requests   Final    BOTTLES DRAWN AEROBIC AND ANAEROBIC Blood Culture adequate volume Performed at [facility], [address], [place], [place] [address]    Culture   Final    NO GROWTH < 12 HOURS Performed at [facility], [address], [place], [place] [address]    Report Status PENDING  Incomplete      Radiology Studies: CT ABDOMEN PELVIS W CONTRAST Result Date: 05/24/2024 CLINICAL DATA:  Abdominal pain, nausea, diarrhea, and chills. EXAM: CT ABDOMEN AND PELVIS WITH CONTRAST TECHNIQUE: Multidetector CT imaging of the abdomen and pelvis was performed using the standard protocol following bolus administration of intravenous contrast. RADIATION DOSE REDUCTION: This exam was performed according to the departmental dose-optimization program which includes automated exposure control, adjustment of the mA and/or kV according to patient size and/or use of iterative reconstruction technique. CONTRAST:  OMNIPAQUE  IOHEXOL  300 MG/ML  SOLN COMPARISON:  03/11/2015. FINDINGS: Lower chest: No acute abnormality.  Hepatobiliary: No focal liver abnormality is seen. No gallstones, gallbladder wall thickening, or biliary dilatation. Pancreas: Unremarkable. No pancreatic ductal dilatation or surrounding inflammatory changes. Spleen: Normal in size without focal abnormality. Adrenals/Urinary Tract: The adrenal glands are within normal limits. There is patchy hypoenhancement of the right kidney with perinephric fat stranding. A subcentimeter hypodensity is present on the left which is too small to further characterize. No renal calculus or obstructive uropathy bilaterally. The bladder is within normal limits. Stomach/Bowel: The stomach is within normal limits. No bowel obstruction, free air, or pneumatosis is seen. A few scattered diverticula are present along the colon without evidence of  diverticulitis. Appendix appears normal. Vascular/Lymphatic: No significant vascular findings are present. No enlarged abdominal or pelvic lymph nodes. Reproductive: Uterus and bilateral adnexa are unremarkable. Other: No abdominopelvic ascites. Musculoskeletal: No acute osseous abnormality. IMPRESSION: Patchy hypoenhancement of the right kidney with perinephric fat stranding, compatible with pyelonephritis. No obstructive uropathy bilaterally. Electronically Signed   By: Leita Birmingham M.D.   On: 05/24/2024 14:14       LOS: 1 day   Meenakshi Sazama  [facility] Pager on [email or web address]  05/25/2024, 8:59 AM

## 2024-05-25 NOTE — TOC Initial Note (Signed)
 Transition of Care [facility]) - Initial/Assessment Note    Patient Details  Name: Donna Hayes MRN: [id number] Date of Birth: 1985/10/21  [facility]) CM/SW Contact:    Doneta Glenys DASEN, RN Phone Number: 05/25/2024, 5:12 PM  Clinical Narrative:                 Presented for abdominal pain from home. CM induced and explained role. PTA lives in a house with Lamar Lukes S.O. [phone], sister and minor children;verified PCP/insurance;denies DME, HH, oxygen or SDOH needs. States feel safe returning home and Lamar will transport home at discharge. No IP CM needs identified during visit  Expected Discharge Plan: Home/Self Care Barriers to Discharge: No Barriers Identified   Patient Goals and CMS Choice Patient states their goals for this hospitalization and ongoing recovery are:: Home with S.O and children CMS Medicare.gov Compare Post Acute Care list provided to::  (NA) Choice offered to / list presented to : NA [facility] ownership interest in [facility].provided to:: Parent NA    Expected Discharge Plan and Services In-house Referral: NA Discharge Planning Services: CM Consult Post Acute Care Choice: NA Living arrangements for the past 2 months: Single Family Home                 DME Arranged: N/A DME Agency: NA       HH Arranged: NA HH Agency: NA        Prior Living Arrangements/Services Living arrangements for the past 2 months: Single Family Home Lives with:: Minor Children, Significant Other, Relatives (sister) Patient language and need for interpreter reviewed:: Yes Do you feel safe going back to the place where you live?: Yes      Need for Family Participation in Patient Care: No (Comment) Care giver support system in place?: Yes (comment) Current home services:  (NA) Criminal Activity/Legal Involvement Pertinent to Current Situation/Hospitalization: No - Comment as needed  Activities of Daily Living   ADL Screening (condition at  time of admission) Independently performs ADLs?: Yes (appropriate for developmental age) Is the patient deaf or have difficulty hearing?: No Does the patient have difficulty seeing, even when wearing glasses/contacts?: No Does the patient have difficulty concentrating, remembering, or making decisions?: No  Permission Sought/Granted Permission sought to share information with : Case Manager Permission granted to share information with : Yes, Verbal Permission Granted  Share Information with NAME: Lamar Lukes S.O. [phone]           Emotional Assessment Appearance:: Appears stated age Attitude/Demeanor/Rapport: Engaged Affect (typically observed): Appropriate Orientation: : Oriented to Self, Oriented to Place, Oriented to  Time, Oriented to Situation Alcohol / Substance Use: Not Applicable Psych Involvement: No (comment)  Admission diagnosis:  Pyelonephritis [N12] Acute pyelonephritis [N10] Sepsis, due to unspecified organism, unspecified whether acute organ dysfunction present [facility]) [A41.9] Patient Active Problem List   Diagnosis Date Noted   Acute pyelonephritis 05/24/2024   Anemia 05/24/2024   Obesity (BMI 30-39.9) 05/24/2024   Sepsis (HCC) 05/24/2024   Diarrhea 05/24/2024   Varicose veins of bilateral lower extremities with other complications 05/13/2014   Constipation 10/14/2013   S/P C-section 10/04/2013   Heartburn in pregnancy in third trimester 09/29/2013   Sickle cell trait (HCC) 09/27/2013   History of cervical LEEP biopsy affecting care of mother, antepartum 09/27/2013   Supervision of low-risk pregnancy 09/09/2013   Previous cesarean delivery, antepartum condition or complication 09/09/2013   PCP:  Nicholaus Aleck LABOR, PA-C (Inactive) Pharmacy:   [id number] - [place], [place] - [address] [place] [address] Phone: [phone] Fax: [phone]     Social Drivers of  Health (SDOH) Social History: SDOH Screenings   Food Insecurity: No Food Insecurity (05/24/2024)  Housing: Unknown (05/24/2024)  Transportation Needs: No Transportation Needs (05/24/2024)  Utilities: Not At Risk (05/24/2024)  Financial Resource Strain: Medium Risk (10/21/2023)   Received from [facility]  Physical Activity: Insufficiently Active (04/09/2021)   [facility]  Social Connections: Unknown (02/04/2022)   Received from [facility]  Stress: Stress Concern Present (04/09/2021)   Received from [facility]  Tobacco Use: Low Risk  (05/24/2024)  Recent Concern: Tobacco Use - Medium Risk (05/13/2024)   Received from [facility]   SDOH Interventions:     Readmission Risk Interventions    05/25/2024    5:08 PM  Readmission Risk Prevention Plan  Post Dischage Appt Complete  Medication Screening Complete  Transportation Screening Complete

## 2024-05-26 ENCOUNTER — Other Ambulatory Visit (HOSPITAL_COMMUNITY): Payer: Self-pay

## 2024-05-26 DIAGNOSIS — N10 Acute tubulo-interstitial nephritis: Secondary | ICD-10-CM | POA: Diagnosis not present

## 2024-05-26 LAB — CBC
HCT: 28.9 % — ABNORMAL LOW (ref 36.0–46.0)
Hemoglobin: 9.5 g/dL — ABNORMAL LOW (ref 12.0–15.0)
MCH: 26.1 pg (ref 26.0–34.0)
MCHC: 32.9 g/dL (ref 30.0–36.0)
MCV: 79.4 fL — ABNORMAL LOW (ref 80.0–100.0)
Platelets: 230 K/uL (ref 150–400)
RBC: 3.64 MIL/uL — ABNORMAL LOW (ref 3.87–5.11)
RDW: 16.4 % — ABNORMAL HIGH (ref 11.5–15.5)
WBC: 5.6 K/uL (ref 4.0–10.5)
nRBC: 0 % (ref 0.0–0.2)

## 2024-05-26 LAB — RETICULOCYTES
Immature Retic Fract: 8.9 % (ref 2.3–15.9)
RBC.: 3.79 MIL/uL — ABNORMAL LOW (ref 3.87–5.11)
Retic Count, Absolute: 25 K/uL (ref 19.0–186.0)
Retic Ct Pct: 0.7 % (ref 0.4–3.1)

## 2024-05-26 LAB — GASTROINTESTINAL PANEL BY PCR, STOOL (REPLACES STOOL CULTURE)

## 2024-05-26 LAB — BASIC METABOLIC PANEL WITH GFR
Anion gap: 10 (ref 5–15)
BUN: 7 mg/dL (ref 6–20)
CO2: 23 mmol/L (ref 22–32)
Calcium: 8.5 mg/dL — ABNORMAL LOW (ref 8.9–10.3)
Chloride: 105 mmol/L (ref 98–111)
Creatinine, Ser: 0.79 mg/dL (ref 0.44–1.00)
GFR, Estimated: >60 mL/min
Glucose, Bld: 118 mg/dL — ABNORMAL HIGH (ref 70–99)
Potassium: 4.7 mmol/L (ref 3.5–5.1)
Sodium: 139 mmol/L (ref 135–145)

## 2024-05-26 LAB — URINE CULTURE: Culture: >=100000 — AB

## 2024-05-26 LAB — CULTURE, BLOOD (ROUTINE X 2)

## 2024-05-26 MED ORDER — FERROUS SULFATE 325 (65 FE) MG PO TBEC
325.0000 mg | DELAYED_RELEASE_TABLET | Freq: Every day | ORAL | 0 refills | Status: AC
  Filled 2024-05-26: qty 100, 100d supply, fill #0

## 2024-05-26 MED ORDER — CEFADROXIL 500 MG PO CAPS
1000.0000 mg | ORAL_CAPSULE | Freq: Two times a day (BID) | ORAL | 0 refills | Status: AC
  Filled 2024-05-26: qty 44, 10d supply, fill #0

## 2024-05-26 MED ORDER — CEFADROXIL 500 MG PO CAPS
1000.0000 mg | ORAL_CAPSULE | Freq: Two times a day (BID) | ORAL | Status: DC
  Administered 2024-05-26: 1000 mg via ORAL
  Filled 2024-05-26 (×2): qty 2

## 2024-05-26 NOTE — Discharge Summary (Signed)
 Physician Discharge Summary  Donna Hayes FMW:[id number] DOB: January 02, 1986 DOA: 05/24/2024  PCP: Nicholaus Aleck LABOR, PA-C (Inactive)  Admit date: 05/24/2024 Discharge date: 05/26/2024  Admitted From: Home Disposition: Home  Recommendations for Outpatient Follow-up:  Follow up with PCP in 1-2 weeks as scheduled  Home Health: None Equipment/Devices: None  Discharge Condition: Stable CODE STATUS: Full Diet recommendation: Regular diet as tolerated  Brief/Interim Summary: 38 y.o. female with medical history significant of morbid obesity, HLD, presented with abdominal pain, back pain nausea vomiting.  Found to have acute pyelonephritis.  She was hospitalized for further management.   Pain improved with antibiotics and supportive care.  She is otherwise stable and agreeable for discharge home at this time to continue antibiotic therapy as discussed.  Follow-up with PCP in the next 1 to 2 weeks for further evaluation and treatment.   Discharge Diagnoses:  Principal Problem:   Acute pyelonephritis Active Problems:   Anemia   Obesity (BMI 30-39.9)   Sepsis (HCC)   Diarrhea  Acute pyelonephritis CT scan did not show any complicating features.  Patient does not meet sepsis criteria  Iron deficiency anemia, microcytic  Iron panel confirms iron deficiency, continue supplementation and dietary improvement as discussed   Class III obesity Estimated body mass index is 48.6 kg/m as calculated from the following:   Height as of 11/23/22: 5' 2.5 (1.588 m).   Weight as of 11/23/22: 122.5 kg.  Discharge Instructions  Discharge Instructions     Call MD for:  difficulty breathing, headache or visual disturbances   Complete by: As directed    Call MD for:  extreme fatigue   Complete by: As directed    Call MD for:  hives   Complete by: As directed    Call MD for:  persistant dizziness or light-headedness   Complete by: As directed    Call MD for:  persistant nausea and vomiting    Complete by: As directed    Call MD for:  severe uncontrolled pain   Complete by: As directed    Call MD for:  temperature >100.4   Complete by: As directed    Diet - low sodium heart healthy   Complete by: As directed    Increase activity slowly   Complete by: As directed       Allergies as of 05/26/2024       Reactions   Penicillins Hives        Medication List     TAKE these medications    cefadroxil  500 MG capsule Commonly known as: DURICEF Take 2 capsules (1,000 mg total) by mouth 2 (two) times daily for 11 days.   ferrous sulfate  325 (65 FE) MG EC tablet Take 1 tablet (325 mg total) by mouth daily with breakfast.   ibuprofen  200 MG tablet Commonly known as: ADVIL  Take 200 mg by mouth every 6 (six) hours as needed for mild pain (pain score 1-3) or moderate pain (pain score 4-6).   tacrolimus 0.03 % ointment Commonly known as: PROTOPIC Apply 1 Application topically 2 (two) times daily.        Allergies  Allergen Reactions   Penicillins Hives    Consultations: None   Procedures/Studies: CT ABDOMEN PELVIS W CONTRAST Result Date: 05/24/2024 CLINICAL DATA:  Abdominal pain, nausea, diarrhea, and chills. EXAM: CT ABDOMEN AND PELVIS WITH CONTRAST TECHNIQUE: Multidetector CT imaging of the abdomen and pelvis was performed using the standard protocol following bolus administration of intravenous contrast. RADIATION DOSE REDUCTION: This exam was  performed according to the departmental dose-optimization program which includes automated exposure control, adjustment of the mA and/or kV according to patient size and/or use of iterative reconstruction technique. CONTRAST:  OMNIPAQUE  IOHEXOL  300 MG/ML  SOLN COMPARISON:  03/11/2015. FINDINGS: Lower chest: No acute abnormality. Hepatobiliary: No focal liver abnormality is seen. No gallstones, gallbladder wall thickening, or biliary dilatation. Pancreas: Unremarkable. No pancreatic ductal dilatation or surrounding  inflammatory changes. Spleen: Normal in size without focal abnormality. Adrenals/Urinary Tract: The adrenal glands are within normal limits. There is patchy hypoenhancement of the right kidney with perinephric fat stranding. A subcentimeter hypodensity is present on the left which is too small to further characterize. No renal calculus or obstructive uropathy bilaterally. The bladder is within normal limits. Stomach/Bowel: The stomach is within normal limits. No bowel obstruction, free air, or pneumatosis is seen. A few scattered diverticula are present along the colon without evidence of diverticulitis. Appendix appears normal. Vascular/Lymphatic: No significant vascular findings are present. No enlarged abdominal or pelvic lymph nodes. Reproductive: Uterus and bilateral adnexa are unremarkable. Other: No abdominopelvic ascites. Musculoskeletal: No acute osseous abnormality. IMPRESSION: Patchy hypoenhancement of the right kidney with perinephric fat stranding, compatible with pyelonephritis. No obstructive uropathy bilaterally. Electronically Signed   By: Leita [place] M.D.   On: 05/24/2024 14:14     Subjective: No acute issues/events overnight   Discharge Exam: Vitals:   05/26/24 0450 05/26/24 1353  BP: 117/71 117/82  Pulse: 71 82  Resp: 18 20  Temp: 98.8 F (37.1 C) 98.4 F (36.9 C)  SpO2: 100% 100%   Vitals:   05/25/24 1559 05/25/24 1919 05/26/24 0450 05/26/24 1353  BP: 134/84 (!) 124/94 117/71 117/82  Pulse: 90 90 71 82  Resp:  18 18 20   Temp: 99.1 F (37.3 C) 99.2 F (37.3 C) 98.8 F (37.1 C) 98.4 F (36.9 C)  TempSrc:      SpO2: 100% 99% 100% 100%    General: Pt is alert, awake, not in acute distress Cardiovascular: RRR, S1/S2 +, no rubs, no gallops Respiratory: CTA bilaterally, no wheezing, no rhonchi Abdominal: Soft, NT, ND, bowel sounds + Extremities: no edema, no cyanosis    The results of significant diagnostics from this hospitalization (including imaging,  microbiology, ancillary and laboratory) are listed below for reference.     Microbiology: Recent Results (from the past 240 hours)  Resp panel by RT-PCR (RSV, Flu A&B, Covid) Anterior Nasal Swab     Status: None   Collection Time: 05/24/24 12:01 PM   Specimen: Anterior Nasal Swab  Result Value Ref Range Status   SARS Coronavirus 2 by RT PCR NEGATIVE NEGATIVE Final    Comment: (NOTE) SARS-CoV-2 target nucleic acids are NOT DETECTED.  The SARS-CoV-2 RNA is generally detectable in upper respiratory specimens during the acute phase of infection. The lowest concentration of SARS-CoV-2 viral copies this assay can detect is 138 copies/mL. A negative result does not preclude SARS-Cov-2 infection and should not be used as the sole basis for treatment or other patient management decisions. A negative result may occur with  improper specimen collection/handling, submission of specimen other than nasopharyngeal swab, presence of viral mutation(s) within the areas targeted by this assay, and inadequate number of viral copies(<138 copies/mL). A negative result must be combined with clinical observations, patient history, and epidemiological information. The expected result is Negative.  Fact Sheet for Patients:  BloggerCourse.com  Fact Sheet for Healthcare Providers:  SeriousBroker.it  This test is no t yet approved or cleared by the  [place]  FDA and  has been authorized for detection and/or diagnosis of SARS-CoV-2 by FDA under an Emergency Use Authorization (EUA). This EUA will remain  in effect (meaning this test can be used) for the duration of the COVID-19 declaration under Section 564(b)(1) of the Act, 21 U.S.C.section 360bbb-3(b)(1), unless the authorization is terminated  or revoked sooner.       Influenza A by PCR NEGATIVE NEGATIVE Final   Influenza B by PCR NEGATIVE NEGATIVE Final    Comment: (NOTE) The Xpert Xpress  SARS-CoV-2/FLU/RSV plus assay is intended as an aid in the diagnosis of influenza from Nasopharyngeal swab specimens and should not be used as a sole basis for treatment. Nasal washings and aspirates are unacceptable for Xpert Xpress SARS-CoV-2/FLU/RSV testing.  Fact Sheet for Patients: BloggerCourse.com  Fact Sheet for Healthcare Providers: SeriousBroker.it  This test is not yet approved or cleared by the [place]  FDA and has been authorized for detection and/or diagnosis of SARS-CoV-2 by FDA under an Emergency Use Authorization (EUA). This EUA will remain in effect (meaning this test can be used) for the duration of the COVID-19 declaration under Section 564(b)(1) of the Act, 21 U.S.C. section 360bbb-3(b)(1), unless the authorization is terminated or revoked.     Resp Syncytial Virus by PCR NEGATIVE NEGATIVE Final    Comment: (NOTE) Fact Sheet for Patients: BloggerCourse.com  Fact Sheet for Healthcare Providers: SeriousBroker.it  This test is not yet approved or cleared by the [place]  FDA and has been authorized for detection and/or diagnosis of SARS-CoV-2 by FDA under an Emergency Use Authorization (EUA). This EUA will remain in effect (meaning this test can be used) for the duration of the COVID-19 declaration under Section 564(b)(1) of the Act, 21 U.S.C. section 360bbb-3(b)(1), unless the authorization is terminated or revoked.  Performed at [facility], [address] [place], [place] [address]   Urine Culture     Status: Abnormal   Collection Time: 05/24/24 12:01 PM   Specimen: Urine, Clean Catch  Result Value Ref Range Status   Specimen Description   Final    URINE, CLEAN CATCH Performed at [facility], [address] [place], [place] [address]    Special Requests   Final    NONE Performed at [facility], [address] [place], [place] [address]    Culture >=100,000 COLONIES/mL ESCHERICHIA COLI (A)  Final   Report Status 05/26/2024 FINAL  Final   Organism ID, Bacteria ESCHERICHIA COLI (A)  Final      Susceptibility   Escherichia coli - MIC*    AMPICILLIN >=32 RESISTANT Resistant     CEFAZOLIN  (URINE) Value in next row Sensitive      4 SENSITIVEThis is a modified FDA-approved test that has been validated and its performance characteristics determined by the reporting laboratory.  This laboratory is certified under the Clinical Laboratory Improvement Amendments CLIA as qualified to perform high complexity clinical laboratory testing.    CEFEPIME Value in next row Sensitive      4 SENSITIVEThis is a modified FDA-approved test that has been validated and its performance characteristics determined by the reporting laboratory.  This laboratory is certified under the Clinical Laboratory Improvement Amendments CLIA as qualified to perform high complexity clinical laboratory testing.    ERTAPENEM Value in next row Sensitive      4 SENSITIVEThis is a modified FDA-approved test that has been validated and its performance characteristics determined by the reporting laboratory.  This laboratory is certified under the Clinical Laboratory Improvement Amendments CLIA as qualified to perform high complexity clinical laboratory testing.    CEFTRIAXONE  Value in next row Sensitive      4 SENSITIVEThis is a modified FDA-approved test that has been validated and its performance characteristics determined by the reporting laboratory.  This laboratory is certified under the Clinical Laboratory Improvement Amendments CLIA as qualified to perform high complexity clinical laboratory testing.    CIPROFLOXACIN Value in next row Intermediate      4 SENSITIVEThis is a modified FDA-approved test that has been validated and its performance characteristics determined by the reporting laboratory.  This laboratory is certified under the  Clinical Laboratory Improvement Amendments CLIA as qualified to perform high complexity clinical laboratory testing.    GENTAMICIN Value in next row Sensitive      4 SENSITIVEThis is a modified FDA-approved test that has been validated and its performance characteristics determined by the reporting laboratory.  This laboratory is certified under the Clinical Laboratory Improvement Amendments CLIA as qualified to perform high complexity clinical laboratory testing.    NITROFURANTOIN  Value in next row Sensitive      4 SENSITIVEThis is a modified FDA-approved test that has been validated and its performance characteristics determined by the reporting laboratory.  This laboratory is certified under the Clinical Laboratory Improvement Amendments CLIA as qualified to perform high complexity clinical laboratory testing.    TRIMETH/SULFA Value in next row Sensitive      4 SENSITIVEThis is a modified FDA-approved test that has been validated and its performance characteristics determined by the reporting laboratory.  This laboratory is certified under the Clinical Laboratory Improvement Amendments CLIA as qualified to perform high complexity clinical laboratory testing.    AMPICILLIN/SULBACTAM Value in next row Intermediate      4 SENSITIVEThis is a modified FDA-approved test that has been validated and its performance characteristics determined by the reporting laboratory.  This laboratory is certified under the Clinical Laboratory Improvement Amendments CLIA as qualified to perform high complexity clinical laboratory testing.    PIP/TAZO Value in next row Sensitive ug/mL     <=4 SENSITIVEThis is a modified FDA-approved test that has been validated and its performance characteristics determined by the reporting laboratory.  This laboratory is certified under the Clinical Laboratory Improvement Amendments CLIA as qualified to perform high complexity clinical laboratory testing.    MEROPENEM Value in next row  Sensitive      <=4 SENSITIVEThis is a modified FDA-approved test that has been validated and its performance characteristics determined by the reporting laboratory.  This laboratory is certified under the Clinical Laboratory Improvement Amendments CLIA as qualified to perform high complexity clinical laboratory testing.    * >=100,000 COLONIES/mL ESCHERICHIA COLI  Blood culture (routine x 2)     Status: Abnormal   Collection Time: 05/24/24 12:01 PM   Specimen: BLOOD  Result Value Ref Range Status   Specimen Description   Final    BLOOD RIGHT ANTECUBITAL Performed at [facility], [address] [place], [place] [address]    Special Requests   Final    BOTTLES DRAWN AEROBIC AND ANAEROBIC Blood Culture results may not be optimal due to an inadequate volume of blood received in culture bottles Performed at [facility], [address] [place], [place] [address]    Culture  Setup Time   Final    GRAM POSITIVE COCCI IN CLUSTERS AEROBIC BOTTLE ONLY CRITICAL RESULT CALLED TO, READ  BACK BY AND VERIFIED WITH: PHARMD NICK GLOGOVAC ON 05/25/24 @ 1251 BY DRT    Culture (A)  Final    STAPHYLOCOCCUS HOMINIS THE SIGNIFICANCE OF ISOLATING THIS ORGANISM FROM A SINGLE VENIPUNCTURE CANNOT BE PREDICTED WITHOUT FURTHER CLINICAL AND CULTURE CORRELATION. SUSCEPTIBILITIES AVAILABLE ONLY ON REQUEST. Performed at [facility], [address] [place], [place] [address]    Report Status 05/26/2024 FINAL  Final  Blood Culture ID Panel (Reflexed)     Status: Abnormal   Collection Time: 05/24/24 12:01 PM  Result Value Ref Range Status   Enterococcus faecalis NOT DETECTED NOT DETECTED Final   Enterococcus Faecium NOT DETECTED NOT DETECTED Final   Listeria monocytogenes NOT DETECTED NOT DETECTED Final   Staphylococcus species DETECTED (A) NOT DETECTED Final    Comment: CRITICAL RESULT CALLED TO, READ BACK BY AND VERIFIED WITH: PHARMD NICK GLOGOVAC ON 05/25/24 @ 1251 BY DRT     Staphylococcus aureus (BCID) NOT DETECTED NOT DETECTED Final   Staphylococcus epidermidis NOT DETECTED NOT DETECTED Final   Staphylococcus lugdunensis NOT DETECTED NOT DETECTED Final   Streptococcus species NOT DETECTED NOT DETECTED Final   Streptococcus agalactiae NOT DETECTED NOT DETECTED Final   Streptococcus pneumoniae NOT DETECTED NOT DETECTED Final   Streptococcus pyogenes NOT DETECTED NOT DETECTED Final   A.calcoaceticus-baumannii NOT DETECTED NOT DETECTED Final   Bacteroides fragilis NOT DETECTED NOT DETECTED Final   Enterobacterales NOT DETECTED NOT DETECTED Final   Enterobacter cloacae complex NOT DETECTED NOT DETECTED Final   Escherichia coli NOT DETECTED NOT DETECTED Final   Klebsiella aerogenes NOT DETECTED NOT DETECTED Final   Klebsiella oxytoca NOT DETECTED NOT DETECTED Final   Klebsiella pneumoniae NOT DETECTED NOT DETECTED Final   Proteus species NOT DETECTED NOT DETECTED Final   Salmonella species NOT DETECTED NOT DETECTED Final   Serratia marcescens NOT DETECTED NOT DETECTED Final   Haemophilus influenzae NOT DETECTED NOT DETECTED Final   Neisseria meningitidis NOT DETECTED NOT DETECTED Final   Pseudomonas aeruginosa NOT DETECTED NOT DETECTED Final   Stenotrophomonas maltophilia NOT DETECTED NOT DETECTED Final   Candida albicans NOT DETECTED NOT DETECTED Final   Candida auris NOT DETECTED NOT DETECTED Final   Candida glabrata NOT DETECTED NOT DETECTED Final   Candida krusei NOT DETECTED NOT DETECTED Final   Candida parapsilosis NOT DETECTED NOT DETECTED Final   Candida tropicalis NOT DETECTED NOT DETECTED Final   Cryptococcus neoformans/gattii NOT DETECTED NOT DETECTED Final    Comment: Performed at [facility], [address], [place], [place] [address]  Blood culture (routine x 2)     Status: None (Preliminary result)   Collection Time: 05/24/24  7:23 PM   Specimen: BLOOD LEFT ARM  Result Value Ref Range Status   Specimen Description   Final    BLOOD  LEFT ARM Performed at [facility], [address], [place], [place] [address]    Special Requests   Final    BOTTLES DRAWN AEROBIC AND ANAEROBIC Blood Culture adequate volume Performed at [facility], [address] [place], [place] [address]    Culture   Final    NO GROWTH 2 DAYS Performed at [facility], [address] [place], [place] [address]    Report Status PENDING  Incomplete  Gastrointestinal Panel by PCR , Stool     Status: None   Collection Time: 05/25/24 12:33 PM   Specimen: Stool  Result Value Ref Range Status   Campylobacter species NOT  DETECTED NOT DETECTED Final   Plesimonas shigelloides NOT DETECTED NOT DETECTED Final   Salmonella species NOT DETECTED NOT DETECTED Final   Yersinia enterocolitica NOT DETECTED NOT DETECTED Final   Vibrio species NOT DETECTED NOT DETECTED Final   Vibrio cholerae NOT DETECTED NOT DETECTED Final   Enteroaggregative E coli (EAEC) NOT DETECTED NOT DETECTED Final   Enteropathogenic E coli (EPEC) NOT DETECTED NOT DETECTED Final   Enterotoxigenic E coli (ETEC) NOT DETECTED NOT DETECTED Final   Shiga like toxin producing E coli (STEC) NOT DETECTED NOT DETECTED Final   Shigella/Enteroinvasive E coli (EIEC) NOT DETECTED NOT DETECTED Final   Cryptosporidium NOT DETECTED NOT DETECTED Final   Cyclospora cayetanensis NOT DETECTED NOT DETECTED Final   Entamoeba histolytica NOT DETECTED NOT DETECTED Final   Giardia lamblia NOT DETECTED NOT DETECTED Final   Adenovirus F40/41 NOT DETECTED NOT DETECTED Final   Astrovirus NOT DETECTED NOT DETECTED Final   Norovirus GI/GII NOT DETECTED NOT DETECTED Final   Rotavirus A NOT DETECTED NOT DETECTED Final   Sapovirus (I, II, IV, and V) NOT DETECTED NOT DETECTED Final    Comment: Performed at [facility], [address] [place], [place] [address]     Labs: BNP (last 3 results) No results for input(s): BNP in the last 8760 hours. Basic Metabolic  Panel: Recent Labs  Lab 05/24/24 1201 05/25/24 0511 05/26/24 0533  NA 133* 137 139  K 4.5 3.9 4.7  CL 98 105 105  CO2 24 21* 23  GLUCOSE 136* 114* 118*  BUN 10 8 7   CREATININE 1.00 0.85 0.79  CALCIUM 8.7* 8.0* 8.5*  MG  --  2.4  --   PHOS  --  2.8  --    Liver Function Tests: Recent Labs  Lab 05/24/24 1201 05/25/24 0511  AST 33 19  ALT 28 19  ALKPHOS 81 65  BILITOT 0.5 0.3  PROT 8.4* 6.7  ALBUMIN 3.9 3.3*   Recent Labs  Lab 05/24/24 1201  LIPASE 19   No results for input(s): AMMONIA in the last 168 hours. CBC: Recent Labs  Lab 05/24/24 1201 05/25/24 0511 05/26/24 0533  WBC 7.7 6.4 5.6  NEUTROABS 5.5  --   --   HGB 11.9* 9.6* 9.5*  HCT 35.1* 29.6* 28.9*  MCV 76.1* 78.1* 79.4*  PLT 265 231 230   Cardiac Enzymes: No results for input(s): CKTOTAL, CKMB, CKMBINDEX, TROPONINI in the last 168 hours. BNP: Invalid input(s): POCBNP CBG: No results for input(s): GLUCAP in the last 168 hours. D-Dimer No results for input(s): DDIMER in the last 72 hours. Hgb A1c No results for input(s): HGBA1C in the last 72 hours. Lipid Profile No results for input(s): CHOL, HDL, LDLCALC, TRIG, CHOLHDL, LDLDIRECT in the last 72 hours. Thyroid function studies No results for input(s): TSH, T4TOTAL, T3FREE, THYROIDAB in the last 72 hours.  Invalid input(s): FREET3 Anemia work up Recent Labs    05/25/24 0511 05/26/24 0533  VITAMINB12 314  --   FOLATE 19.8  --   FERRITIN 102  --   TIBC 253  --   IRON 13*  --   RETICCTPCT 0.7 0.7   Urinalysis    Component Value Date/Time   COLORURINE YELLOW 05/24/2024 1201   APPEARANCEUR HAZY (A) 05/24/2024 1201   LABSPEC 1.010 05/24/2024 1201   PHURINE 6.0 05/24/2024 1201   GLUCOSEU NEGATIVE 05/24/2024 1201   HGBUR SMALL (A) 05/24/2024 1201   BILIRUBINUR NEGATIVE 05/24/2024 1201   KETONESUR NEGATIVE 05/24/2024 1201  PROTEINUR 30 (A) 05/24/2024 1201   UROBILINOGEN 0.2 03/11/2015 1851    NITRITE NEGATIVE 05/24/2024 1201   LEUKOCYTESUR LARGE (A) 05/24/2024 1201   Sepsis Labs Recent Labs  Lab 05/24/24 1201 05/25/24 0511 05/26/24 0533  WBC 7.7 6.4 5.6   Microbiology Recent Results (from the past 240 hours)  Resp panel by RT-PCR (RSV, Flu A&B, Covid) Anterior Nasal Swab     Status: None   Collection Time: 05/24/24 12:01 PM   Specimen: Anterior Nasal Swab  Result Value Ref Range Status   SARS Coronavirus 2 by RT PCR NEGATIVE NEGATIVE Final    Comment: (NOTE) SARS-CoV-2 target nucleic acids are NOT DETECTED.  The SARS-CoV-2 RNA is generally detectable in upper respiratory specimens during the acute phase of infection. The lowest concentration of SARS-CoV-2 viral copies this assay can detect is 138 copies/mL. A negative result does not preclude SARS-Cov-2 infection and should not be used as the sole basis for treatment or other patient management decisions. A negative result may occur with  improper specimen collection/handling, submission of specimen other than nasopharyngeal swab, presence of viral mutation(s) within the areas targeted by this assay, and inadequate number of viral copies(<138 copies/mL). A negative result must be combined with clinical observations, patient history, and epidemiological information. The expected result is Negative.  Fact Sheet for Patients:  BloggerCourse.com  Fact Sheet for Healthcare Providers:  SeriousBroker.it  This test is no t yet approved or cleared by the [place]  FDA and  has been authorized for detection and/or diagnosis of SARS-CoV-2 by FDA under an Emergency Use Authorization (EUA). This EUA will remain  in effect (meaning this test can be used) for the duration of the COVID-19 declaration under Section 564(b)(1) of the Act, 21 U.S.C.section 360bbb-3(b)(1), unless the authorization is terminated  or revoked sooner.       Influenza A by PCR NEGATIVE NEGATIVE  Final   Influenza B by PCR NEGATIVE NEGATIVE Final    Comment: (NOTE) The Xpert Xpress SARS-CoV-2/FLU/RSV plus assay is intended as an aid in the diagnosis of influenza from Nasopharyngeal swab specimens and should not be used as a sole basis for treatment. Nasal washings and aspirates are unacceptable for Xpert Xpress SARS-CoV-2/FLU/RSV testing.  Fact Sheet for Patients: BloggerCourse.com  Fact Sheet for Healthcare Providers: SeriousBroker.it  This test is not yet approved or cleared by the [place]  FDA and has been authorized for detection and/or diagnosis of SARS-CoV-2 by FDA under an Emergency Use Authorization (EUA). This EUA will remain in effect (meaning this test can be used) for the duration of the COVID-19 declaration under Section 564(b)(1) of the Act, 21 U.S.C. section 360bbb-3(b)(1), unless the authorization is terminated or revoked.     Resp Syncytial Virus by PCR NEGATIVE NEGATIVE Final    Comment: (NOTE) Fact Sheet for Patients: BloggerCourse.com  Fact Sheet for Healthcare Providers: SeriousBroker.it  This test is not yet approved or cleared by the [place]  FDA and has been authorized for detection and/or diagnosis of SARS-CoV-2 by FDA under an Emergency Use Authorization (EUA). This EUA will remain in effect (meaning this test can be used) for the duration of the COVID-19 declaration under Section 564(b)(1) of the Act, 21 U.S.C. section 360bbb-3(b)(1), unless the authorization is terminated or revoked.  Performed at [facility], [address], [place], [place] [address]   Urine Culture     Status: Abnormal   Collection Time: 05/24/24 12:01 PM   Specimen: Urine, Clean Catch  Result Value Ref  Range Status   Specimen Description   Final    URINE, CLEAN CATCH Performed at [facility], [address] [place], [place]  [address]    Special Requests   Final    NONE Performed at [facility], [address] [place], [place] [address]    Culture >=100,000 COLONIES/mL ESCHERICHIA COLI (A)  Final   Report Status 05/26/2024 FINAL  Final   Organism ID, Bacteria ESCHERICHIA COLI (A)  Final      Susceptibility   Escherichia coli - MIC*    AMPICILLIN >=32 RESISTANT Resistant     CEFAZOLIN  (URINE) Value in next row Sensitive      4 SENSITIVEThis is a modified FDA-approved test that has been validated and its performance characteristics determined by the reporting laboratory.  This laboratory is certified under the Clinical Laboratory Improvement Amendments CLIA as qualified to perform high complexity clinical laboratory testing.    CEFEPIME Value in next row Sensitive      4 SENSITIVEThis is a modified FDA-approved test that has been validated and its performance characteristics determined by the reporting laboratory.  This laboratory is certified under the Clinical Laboratory Improvement Amendments CLIA as qualified to perform high complexity clinical laboratory testing.    ERTAPENEM Value in next row Sensitive      4 SENSITIVEThis is a modified FDA-approved test that has been validated and its performance characteristics determined by the reporting laboratory.  This laboratory is certified under the Clinical Laboratory Improvement Amendments CLIA as qualified to perform high complexity clinical laboratory testing.    CEFTRIAXONE  Value in next row Sensitive      4 SENSITIVEThis is a modified FDA-approved test that has been validated and its performance characteristics determined by the reporting laboratory.  This laboratory is certified under the Clinical Laboratory Improvement Amendments CLIA as qualified to perform high complexity clinical laboratory testing.    CIPROFLOXACIN Value in next row Intermediate      4 SENSITIVEThis is a modified FDA-approved test that has been validated and its performance  characteristics determined by the reporting laboratory.  This laboratory is certified under the Clinical Laboratory Improvement Amendments CLIA as qualified to perform high complexity clinical laboratory testing.    GENTAMICIN Value in next row Sensitive      4 SENSITIVEThis is a modified FDA-approved test that has been validated and its performance characteristics determined by the reporting laboratory.  This laboratory is certified under the Clinical Laboratory Improvement Amendments CLIA as qualified to perform high complexity clinical laboratory testing.    NITROFURANTOIN  Value in next row Sensitive      4 SENSITIVEThis is a modified FDA-approved test that has been validated and its performance characteristics determined by the reporting laboratory.  This laboratory is certified under the Clinical Laboratory Improvement Amendments CLIA as qualified to perform high complexity clinical laboratory testing.    TRIMETH/SULFA Value in next row Sensitive      4 SENSITIVEThis is a modified FDA-approved test that has been validated and its performance characteristics determined by the reporting laboratory.  This laboratory is certified under the Clinical Laboratory Improvement Amendments CLIA as qualified to perform high complexity clinical laboratory testing.    AMPICILLIN/SULBACTAM Value in next row Intermediate      4 SENSITIVEThis is a modified FDA-approved test that has been validated and its performance characteristics determined by the reporting laboratory.  This laboratory is certified under the Clinical Laboratory Improvement Amendments CLIA as qualified to perform high complexity clinical  laboratory testing.    PIP/TAZO Value in next row Sensitive ug/mL     <=4 SENSITIVEThis is a modified FDA-approved test that has been validated and its performance characteristics determined by the reporting laboratory.  This laboratory is certified under the Clinical Laboratory Improvement Amendments CLIA as  qualified to perform high complexity clinical laboratory testing.    MEROPENEM Value in next row Sensitive      <=4 SENSITIVEThis is a modified FDA-approved test that has been validated and its performance characteristics determined by the reporting laboratory.  This laboratory is certified under the Clinical Laboratory Improvement Amendments CLIA as qualified to perform high complexity clinical laboratory testing.    * >=100,000 COLONIES/mL ESCHERICHIA COLI  Blood culture (routine x 2)     Status: Abnormal   Collection Time: 05/24/24 12:01 PM   Specimen: BLOOD  Result Value Ref Range Status   Specimen Description   Final    BLOOD RIGHT ANTECUBITAL Performed at [facility], [address] [place], [place] [address]    Special Requests   Final    BOTTLES DRAWN AEROBIC AND ANAEROBIC Blood Culture results may not be optimal due to an inadequate volume of blood received in culture bottles Performed at [facility], [address] [place], [place] [address]    Culture  Setup Time   Final    GRAM POSITIVE COCCI IN CLUSTERS AEROBIC BOTTLE ONLY CRITICAL RESULT CALLED TO, READ BACK BY AND VERIFIED WITH: PHARMD NICK GLOGOVAC ON 05/25/24 @ 1251 BY DRT    Culture (A)  Final    STAPHYLOCOCCUS HOMINIS THE SIGNIFICANCE OF ISOLATING THIS ORGANISM FROM A SINGLE VENIPUNCTURE CANNOT BE PREDICTED WITHOUT FURTHER CLINICAL AND CULTURE CORRELATION. SUSCEPTIBILITIES AVAILABLE ONLY ON REQUEST. Performed at [facility], [address], [place], [place] [address]    Report Status 05/26/2024 FINAL  Final  Blood Culture ID Panel (Reflexed)     Status: Abnormal   Collection Time: 05/24/24 12:01 PM  Result Value Ref Range Status   Enterococcus faecalis NOT DETECTED NOT DETECTED Final   Enterococcus Faecium NOT DETECTED NOT DETECTED Final   Listeria monocytogenes NOT DETECTED NOT DETECTED Final   Staphylococcus species DETECTED (A) NOT DETECTED Final    Comment: CRITICAL RESULT CALLED  TO, READ BACK BY AND VERIFIED WITH: PHARMD NICK GLOGOVAC ON 05/25/24 @ 1251 BY DRT    Staphylococcus aureus (BCID) NOT DETECTED NOT DETECTED Final   Staphylococcus epidermidis NOT DETECTED NOT DETECTED Final   Staphylococcus lugdunensis NOT DETECTED NOT DETECTED Final   Streptococcus species NOT DETECTED NOT DETECTED Final   Streptococcus agalactiae NOT DETECTED NOT DETECTED Final   Streptococcus pneumoniae NOT DETECTED NOT DETECTED Final   Streptococcus pyogenes NOT DETECTED NOT DETECTED Final   A.calcoaceticus-baumannii NOT DETECTED NOT DETECTED Final   Bacteroides fragilis NOT DETECTED NOT DETECTED Final   Enterobacterales NOT DETECTED NOT DETECTED Final   Enterobacter cloacae complex NOT DETECTED NOT DETECTED Final   Escherichia coli NOT DETECTED NOT DETECTED Final   Klebsiella aerogenes NOT DETECTED NOT DETECTED Final   Klebsiella oxytoca NOT DETECTED NOT DETECTED Final   Klebsiella pneumoniae NOT DETECTED NOT DETECTED Final   Proteus species NOT DETECTED NOT DETECTED Final   Salmonella species NOT DETECTED NOT DETECTED Final   Serratia marcescens NOT DETECTED NOT DETECTED Final   Haemophilus influenzae NOT DETECTED NOT DETECTED Final   Neisseria meningitidis NOT DETECTED NOT DETECTED Final   Pseudomonas aeruginosa NOT DETECTED NOT DETECTED Final  Stenotrophomonas maltophilia NOT DETECTED NOT DETECTED Final   Candida albicans NOT DETECTED NOT DETECTED Final   Candida auris NOT DETECTED NOT DETECTED Final   Candida glabrata NOT DETECTED NOT DETECTED Final   Candida krusei NOT DETECTED NOT DETECTED Final   Candida parapsilosis NOT DETECTED NOT DETECTED Final   Candida tropicalis NOT DETECTED NOT DETECTED Final   Cryptococcus neoformans/gattii NOT DETECTED NOT DETECTED Final    Comment: Performed at [facility], [address], [place], [place] [address]  Blood culture (routine x 2)     Status: None (Preliminary result)   Collection Time: 05/24/24  7:23 PM   Specimen: BLOOD  LEFT ARM  Result Value Ref Range Status   Specimen Description   Final    BLOOD LEFT ARM Performed at [facility], [address] [place], [place] [address]    Special Requests   Final    BOTTLES DRAWN AEROBIC AND ANAEROBIC Blood Culture adequate volume Performed at [facility], [address] [place], [place] [address]    Culture   Final    NO GROWTH 2 DAYS Performed at [facility], [address], [place], [place] [address]    Report Status PENDING  Incomplete  Gastrointestinal Panel by PCR , Stool     Status: None   Collection Time: 05/25/24 12:33 PM   Specimen: Stool  Result Value Ref Range Status   Campylobacter species NOT DETECTED NOT DETECTED Final   Plesimonas shigelloides NOT DETECTED NOT DETECTED Final   Salmonella species NOT DETECTED NOT DETECTED Final   Yersinia enterocolitica NOT DETECTED NOT DETECTED Final   Vibrio species NOT DETECTED NOT DETECTED Final   Vibrio cholerae NOT DETECTED NOT DETECTED Final   Enteroaggregative E coli (EAEC) NOT DETECTED NOT DETECTED Final   Enteropathogenic E coli (EPEC) NOT DETECTED NOT DETECTED Final   Enterotoxigenic E coli (ETEC) NOT DETECTED NOT DETECTED Final   Shiga like toxin producing E coli (STEC) NOT DETECTED NOT DETECTED Final   Shigella/Enteroinvasive E coli (EIEC) NOT DETECTED NOT DETECTED Final   Cryptosporidium NOT DETECTED NOT DETECTED Final   Cyclospora cayetanensis NOT DETECTED NOT DETECTED Final   Entamoeba histolytica NOT DETECTED NOT DETECTED Final   Giardia lamblia NOT DETECTED NOT DETECTED Final   Adenovirus F40/41 NOT DETECTED NOT DETECTED Final   Astrovirus NOT DETECTED NOT DETECTED Final   Norovirus GI/GII NOT DETECTED NOT DETECTED Final   Rotavirus A NOT DETECTED NOT DETECTED Final   Sapovirus (I, II, IV, and V) NOT DETECTED NOT DETECTED Final    Comment: Performed at [facility], [address] [place], [place] [address]     Time coordinating discharge:  Over 30 minutes  SIGNED:   Elsie JAYSON Montclair, DO [facility] 05/26/2024, 3:46 PM Pager   If 7PM-7AM, please contact night-coverage [email or web address]

## 2024-05-26 NOTE — Progress Notes (Signed)
 Discharge instructions given to patient questions asked and answered. Discharge mediations delivered to patient at bedside  [facility]

## 2024-05-29 LAB — CULTURE, BLOOD (ROUTINE X 2)
Culture: NO GROWTH
Special Requests: ADEQUATE
